# Patient Record
Sex: Female | Born: 1960 | Race: White | Hispanic: No | Marital: Single | Smoking: Former smoker
Health system: Southern US, Community
[De-identification: ages and names within clinical notes are randomized; demographics above are authoritative.]

## PROBLEM LIST (undated history)

## (undated) DIAGNOSIS — I10 Essential (primary) hypertension: Secondary | ICD-10-CM

## (undated) DIAGNOSIS — G43909 Migraine, unspecified, not intractable, without status migrainosus: Secondary | ICD-10-CM

## (undated) DIAGNOSIS — E119 Type 2 diabetes mellitus without complications: Secondary | ICD-10-CM

## (undated) DIAGNOSIS — F419 Anxiety disorder, unspecified: Secondary | ICD-10-CM

## (undated) DIAGNOSIS — Z923 Personal history of irradiation: Secondary | ICD-10-CM

## (undated) DIAGNOSIS — C50919 Malignant neoplasm of unspecified site of unspecified female breast: Secondary | ICD-10-CM

## (undated) HISTORY — DX: Malignant neoplasm of unspecified site of unspecified female breast: C50.919

## (undated) HISTORY — DX: Type 2 diabetes mellitus without complications: E11.9

## (undated) HISTORY — PX: ABDOMINAL HYSTERECTOMY: SHX81

## (undated) HISTORY — DX: Essential (primary) hypertension: I10

## (undated) HISTORY — DX: Anxiety disorder, unspecified: F41.9

---

## 2006-07-25 HISTORY — PX: JOINT REPLACEMENT: SHX530

## 2006-11-23 ENCOUNTER — Other Ambulatory Visit: Payer: Self-pay

## 2006-11-23 ENCOUNTER — Ambulatory Visit: Payer: Self-pay | Admitting: Specialist

## 2006-11-27 ENCOUNTER — Inpatient Hospital Stay: Payer: Self-pay | Admitting: Specialist

## 2008-07-25 DIAGNOSIS — C50919 Malignant neoplasm of unspecified site of unspecified female breast: Secondary | ICD-10-CM

## 2008-07-25 HISTORY — DX: Malignant neoplasm of unspecified site of unspecified female breast: C50.919

## 2008-07-25 HISTORY — PX: LYMPH NODE DISSECTION: SHX5087

## 2008-07-25 HISTORY — PX: BILATERAL TOTAL MASTECTOMY WITH AXILLARY LYMPH NODE DISSECTION: SHX6364

## 2008-07-25 HISTORY — PX: PORTACATH PLACEMENT: SHX2246

## 2009-04-02 ENCOUNTER — Emergency Department: Payer: Self-pay | Admitting: Emergency Medicine

## 2009-09-22 ENCOUNTER — Ambulatory Visit: Payer: Self-pay | Admitting: Oncology

## 2010-02-22 ENCOUNTER — Ambulatory Visit: Payer: Self-pay | Admitting: Radiation Oncology

## 2010-03-15 ENCOUNTER — Ambulatory Visit: Payer: Self-pay | Admitting: Radiation Oncology

## 2010-03-25 ENCOUNTER — Ambulatory Visit: Payer: Self-pay | Admitting: Radiation Oncology

## 2010-04-24 ENCOUNTER — Ambulatory Visit: Payer: Self-pay | Admitting: Radiation Oncology

## 2010-05-25 ENCOUNTER — Ambulatory Visit: Payer: Self-pay | Admitting: Radiation Oncology

## 2010-06-24 ENCOUNTER — Ambulatory Visit: Payer: Self-pay | Admitting: Radiation Oncology

## 2010-07-25 HISTORY — PX: PORT-A-CATH REMOVAL: SHX5289

## 2010-08-10 ENCOUNTER — Ambulatory Visit: Payer: Self-pay | Admitting: Radiation Oncology

## 2010-08-11 LAB — CANCER ANTIGEN 27.29: CA 27.29: 7.7 U/mL (ref 0.0–38.6)

## 2010-08-25 ENCOUNTER — Ambulatory Visit: Payer: Self-pay | Admitting: Radiation Oncology

## 2011-01-14 ENCOUNTER — Ambulatory Visit: Payer: Self-pay | Admitting: Internal Medicine

## 2011-01-23 ENCOUNTER — Ambulatory Visit: Payer: Self-pay | Admitting: Internal Medicine

## 2011-02-07 ENCOUNTER — Ambulatory Visit: Payer: Self-pay | Admitting: Gastroenterology

## 2011-02-08 LAB — PATHOLOGY REPORT

## 2011-05-13 ENCOUNTER — Ambulatory Visit: Payer: Self-pay | Admitting: Internal Medicine

## 2011-05-26 ENCOUNTER — Ambulatory Visit: Payer: Self-pay | Admitting: Internal Medicine

## 2011-06-25 ENCOUNTER — Ambulatory Visit: Payer: Self-pay | Admitting: Internal Medicine

## 2011-07-01 ENCOUNTER — Emergency Department: Payer: Self-pay | Admitting: Emergency Medicine

## 2011-07-26 ENCOUNTER — Ambulatory Visit: Payer: Self-pay | Admitting: Internal Medicine

## 2011-08-26 ENCOUNTER — Ambulatory Visit: Payer: Self-pay | Admitting: Internal Medicine

## 2011-09-27 ENCOUNTER — Ambulatory Visit: Payer: Self-pay | Admitting: Cardiothoracic Surgery

## 2011-10-06 ENCOUNTER — Ambulatory Visit: Payer: Self-pay | Admitting: Radiation Oncology

## 2011-10-24 ENCOUNTER — Ambulatory Visit: Payer: Self-pay | Admitting: Internal Medicine

## 2011-11-15 ENCOUNTER — Ambulatory Visit: Payer: Self-pay | Admitting: Radiation Oncology

## 2011-11-15 ENCOUNTER — Encounter: Payer: Self-pay | Admitting: Physical Medicine & Rehabilitation

## 2011-11-15 LAB — CBC CANCER CENTER
Eosinophil #: 0.2 x10 3/mm (ref 0.0–0.7)
Lymphocyte #: 2.2 x10 3/mm (ref 1.0–3.6)
Monocyte #: 0.6 x10 3/mm (ref 0.2–0.9)
Monocyte %: 5.6 %
Neutrophil #: 6.9 x10 3/mm — ABNORMAL HIGH (ref 1.4–6.5)
Neutrophil %: 69.6 %
RBC: 4.48 10*6/uL (ref 3.80–5.20)
RDW: 14 % (ref 11.5–14.5)

## 2011-11-15 LAB — HEPATIC FUNCTION PANEL A (ARMC)
Albumin: 3.8 g/dL (ref 3.4–5.0)
Alkaline Phosphatase: 101 U/L (ref 50–136)
Bilirubin,Total: 0.4 mg/dL (ref 0.2–1.0)
SGPT (ALT): 55 U/L
Total Protein: 7.2 g/dL (ref 6.4–8.2)

## 2011-11-15 LAB — CREATININE, SERUM
Creatinine: 0.95 mg/dL (ref 0.60–1.30)
EGFR (African American): >60
EGFR (Non-African Amer.): >60

## 2011-11-23 ENCOUNTER — Encounter: Payer: Self-pay | Admitting: Physical Medicine & Rehabilitation

## 2011-11-23 ENCOUNTER — Ambulatory Visit: Payer: Self-pay | Admitting: Radiation Oncology

## 2011-11-23 ENCOUNTER — Ambulatory Visit: Payer: Self-pay | Admitting: Internal Medicine

## 2011-12-24 ENCOUNTER — Encounter: Payer: Self-pay | Admitting: Physical Medicine & Rehabilitation

## 2012-01-23 ENCOUNTER — Encounter: Payer: Self-pay | Admitting: Physical Medicine & Rehabilitation

## 2012-02-23 ENCOUNTER — Encounter: Payer: Self-pay | Admitting: Physical Medicine & Rehabilitation

## 2012-03-20 ENCOUNTER — Ambulatory Visit: Payer: Self-pay | Admitting: General Surgery

## 2012-03-27 ENCOUNTER — Ambulatory Visit: Payer: Self-pay | Admitting: Anesthesiology

## 2012-03-27 LAB — BASIC METABOLIC PANEL
Anion Gap: 5 — ABNORMAL LOW (ref 7–16)
BUN: 12 mg/dL (ref 7–18)
Calcium, Total: 9.1 mg/dL (ref 8.5–10.1)
Chloride: 101 mmol/L (ref 98–107)
Co2: 31 mmol/L (ref 21–32)
Creatinine: 0.66 mg/dL (ref 0.60–1.30)
Glucose: 122 mg/dL — ABNORMAL HIGH (ref 65–99)
Osmolality: 275 (ref 275–301)

## 2012-03-27 LAB — CBC WITH DIFFERENTIAL/PLATELET
Eosinophil #: 0.1 10*3/uL (ref 0.0–0.7)
HCT: 40.5 % (ref 35.0–47.0)
Lymphocyte %: 19 %
MCV: 89 fL (ref 80–100)
Monocyte %: 5.1 %
Neutrophil #: 7.2 10*3/uL — ABNORMAL HIGH (ref 1.4–6.5)
Neutrophil %: 73.9 %
RBC: 4.58 10*6/uL (ref 3.80–5.20)
RDW: 14.1 % (ref 11.5–14.5)
WBC: 9.8 10*3/uL (ref 3.6–11.0)

## 2012-03-29 ENCOUNTER — Ambulatory Visit: Payer: Self-pay | Admitting: General Surgery

## 2012-03-29 HISTORY — PX: HERNIA REPAIR: SHX51

## 2012-03-30 ENCOUNTER — Encounter: Payer: Self-pay | Admitting: Physical Medicine & Rehabilitation

## 2012-05-16 ENCOUNTER — Ambulatory Visit: Payer: Self-pay | Admitting: Internal Medicine

## 2012-05-16 LAB — CBC CANCER CENTER
Lymphocyte %: 21.8 %
MCHC: 32.2 g/dL (ref 32.0–36.0)
MCV: 89 fL (ref 80–100)
Monocyte #: 0.6 x10 3/mm (ref 0.2–0.9)
Monocyte %: 6.1 %
Neutrophil #: 6.7 x10 3/mm — ABNORMAL HIGH (ref 1.4–6.5)
Platelet: 258 x10 3/mm (ref 150–440)
RDW: 13.5 % (ref 11.5–14.5)
WBC: 9.7 x10 3/mm (ref 3.6–11.0)

## 2012-05-16 LAB — HEPATIC FUNCTION PANEL A (ARMC)
Albumin: 3.7 g/dL (ref 3.4–5.0)
Bilirubin, Direct: 0.1 mg/dL (ref 0.00–0.20)
Bilirubin,Total: 0.3 mg/dL (ref 0.2–1.0)
SGOT(AST): 37 U/L (ref 15–37)
SGPT (ALT): 64 U/L (ref 12–78)
Total Protein: 7.5 g/dL (ref 6.4–8.2)

## 2012-05-16 LAB — CREATININE, SERUM
Creatinine: 0.85 mg/dL (ref 0.60–1.30)
EGFR (African American): >60
EGFR (Non-African Amer.): >60

## 2012-05-17 LAB — CANCER ANTIGEN 27.29: CA 27.29: 7.6 U/mL (ref 0.0–38.6)

## 2012-05-25 ENCOUNTER — Ambulatory Visit: Payer: Self-pay | Admitting: Internal Medicine

## 2012-06-24 ENCOUNTER — Ambulatory Visit: Payer: Self-pay | Admitting: Internal Medicine

## 2012-10-23 ENCOUNTER — Ambulatory Visit: Payer: Self-pay | Admitting: Internal Medicine

## 2012-11-14 LAB — URINALYSIS, COMPLETE
Bacteria: NONE SEEN
Bilirubin,UR: NEGATIVE
Blood: NEGATIVE
Glucose,UR: NEGATIVE mg/dL (ref 0–75)
Leukocyte Esterase: NEGATIVE
Protein: NEGATIVE
RBC,UR: 1 /HPF (ref 0–5)
Specific Gravity: 1.019 (ref 1.003–1.030)
Squamous Epithelial: <1

## 2012-11-14 LAB — HEPATIC FUNCTION PANEL A (ARMC)
Albumin: 3.6 g/dL (ref 3.4–5.0)
Bilirubin,Total: 0.4 mg/dL (ref 0.2–1.0)
SGPT (ALT): 58 U/L (ref 12–78)
Total Protein: 7.2 g/dL (ref 6.4–8.2)

## 2012-11-14 LAB — CBC CANCER CENTER
Basophil #: 0.1 x10 3/mm (ref 0.0–0.1)
Basophil %: 0.6 %
HCT: 41.7 % (ref 35.0–47.0)
Lymphocyte %: 20.2 %
MCH: 29.3 pg (ref 26.0–34.0)
MCHC: 33.1 g/dL (ref 32.0–36.0)
MCV: 89 fL (ref 80–100)
Monocyte #: 0.5 x10 3/mm (ref 0.2–0.9)
Monocyte %: 5 %
Neutrophil #: 7.7 x10 3/mm — ABNORMAL HIGH (ref 1.4–6.5)
Neutrophil %: 72.7 %
RBC: 4.71 10*6/uL (ref 3.80–5.20)
RDW: 13.2 % (ref 11.5–14.5)

## 2012-11-14 LAB — CREATININE, SERUM
Creatinine: 0.92 mg/dL (ref 0.60–1.30)
EGFR (African American): >60

## 2012-11-15 LAB — CANCER ANTIGEN 27.29: CA 27.29: 8.9 U/mL (ref 0.0–38.6)

## 2012-11-16 LAB — URINE CULTURE

## 2012-11-22 ENCOUNTER — Ambulatory Visit: Payer: Self-pay | Admitting: Internal Medicine

## 2012-12-05 ENCOUNTER — Ambulatory Visit: Payer: Self-pay | Admitting: Orthopaedic Surgery

## 2013-05-15 ENCOUNTER — Ambulatory Visit: Payer: Self-pay | Admitting: Internal Medicine

## 2013-05-15 LAB — CBC CANCER CENTER
Basophil #: 0.1 x10 3/mm (ref 0.0–0.1)
Basophil %: 0.9 %
Eosinophil #: 0.2 x10 3/mm (ref 0.0–0.7)
Eosinophil %: 2 %
HCT: 44 % (ref 35.0–47.0)
Lymphocyte #: 2.3 x10 3/mm (ref 1.0–3.6)
MCV: 87 fL (ref 80–100)
Monocyte #: 0.6 x10 3/mm (ref 0.2–0.9)
Monocyte %: 6.5 %
Platelet: 281 x10 3/mm (ref 150–440)

## 2013-05-15 LAB — URINALYSIS, COMPLETE
Bilirubin,UR: NEGATIVE
Blood: NEGATIVE
Leukocyte Esterase: NEGATIVE
Nitrite: NEGATIVE
Ph: 5 (ref 4.5–8.0)
Specific Gravity: 1.015 (ref 1.003–1.030)
Squamous Epithelial: <1
WBC UR: 1 /HPF (ref 0–5)

## 2013-05-15 LAB — CREATININE, SERUM
Creatinine: 0.76 mg/dL (ref 0.60–1.30)
EGFR (African American): >60
EGFR (Non-African Amer.): >60

## 2013-05-15 LAB — HEPATIC FUNCTION PANEL A (ARMC)
Albumin: 3.8 g/dL (ref 3.4–5.0)
Alkaline Phosphatase: 103 U/L (ref 50–136)
Bilirubin,Total: 0.4 mg/dL (ref 0.2–1.0)
SGOT(AST): 58 U/L — ABNORMAL HIGH (ref 15–37)

## 2013-05-25 ENCOUNTER — Ambulatory Visit: Payer: Self-pay | Admitting: Internal Medicine

## 2013-05-27 ENCOUNTER — Ambulatory Visit: Payer: Self-pay | Admitting: Internal Medicine

## 2013-05-28 ENCOUNTER — Ambulatory Visit: Payer: Self-pay | Admitting: Internal Medicine

## 2013-07-28 ENCOUNTER — Emergency Department: Payer: Self-pay | Admitting: Internal Medicine

## 2013-07-31 LAB — BETA STREP CULTURE(ARMC)

## 2013-11-12 ENCOUNTER — Ambulatory Visit: Payer: Self-pay | Admitting: Internal Medicine

## 2013-12-11 ENCOUNTER — Ambulatory Visit: Payer: Self-pay | Admitting: Internal Medicine

## 2013-12-11 ENCOUNTER — Emergency Department: Payer: Self-pay | Admitting: Emergency Medicine

## 2013-12-11 LAB — PROTIME-INR
INR: 0.9
Prothrombin Time: 11.6 secs (ref 11.5–14.7)

## 2013-12-11 LAB — COMPREHENSIVE METABOLIC PANEL
ALK PHOS: 153 U/L — AB
ALT: 104 U/L — AB (ref 12–78)
Albumin: 3.3 g/dL — ABNORMAL LOW (ref 3.4–5.0)
Anion Gap: 6 — ABNORMAL LOW (ref 7–16)
BUN: 12 mg/dL (ref 7–18)
Bilirubin,Total: 0.5 mg/dL (ref 0.2–1.0)
CO2: 29 mmol/L (ref 21–32)
Calcium, Total: 8.8 mg/dL (ref 8.5–10.1)
Chloride: 101 mmol/L (ref 98–107)
Creatinine: 0.82 mg/dL (ref 0.60–1.30)
EGFR (Non-African Amer.): >60
GLUCOSE: 118 mg/dL — AB (ref 65–99)
Osmolality: 273 (ref 275–301)
Potassium: 3.6 mmol/L (ref 3.5–5.1)
SGOT(AST): 147 U/L — ABNORMAL HIGH (ref 15–37)
Sodium: 136 mmol/L (ref 136–145)
Total Protein: 7.3 g/dL (ref 6.4–8.2)

## 2013-12-11 LAB — CREATININE, SERUM: CREATININE: 0.76 mg/dL (ref 0.60–1.30)

## 2013-12-11 LAB — CBC
HCT: 39.9 % (ref 35.0–47.0)
HGB: 13.2 g/dL (ref 12.0–16.0)
MCH: 28.7 pg (ref 26.0–34.0)
MCHC: 33.2 g/dL (ref 32.0–36.0)
MCV: 87 fL (ref 80–100)
PLATELETS: 211 10*3/uL (ref 150–440)
RBC: 4.61 10*6/uL (ref 3.80–5.20)
RDW: 14.3 % (ref 11.5–14.5)
WBC: 6 10*3/uL (ref 3.6–11.0)

## 2013-12-11 LAB — CBC CANCER CENTER
BASOS ABS: 0 x10 3/mm (ref 0.0–0.1)
Basophil %: 0.4 %
EOS ABS: 0.1 x10 3/mm (ref 0.0–0.7)
EOS PCT: 1.3 %
HCT: 41.1 % (ref 35.0–47.0)
HGB: 13.7 g/dL (ref 12.0–16.0)
LYMPHS PCT: 29.5 %
Lymphocyte #: 1.8 x10 3/mm (ref 1.0–3.6)
MCH: 28.5 pg (ref 26.0–34.0)
MCHC: 33.4 g/dL (ref 32.0–36.0)
MCV: 85 fL (ref 80–100)
Monocyte #: 0.5 x10 3/mm (ref 0.2–0.9)
Monocyte %: 7.3 %
NEUTROS ABS: 3.8 x10 3/mm (ref 1.4–6.5)
NEUTROS PCT: 61.5 %
PLATELETS: 219 x10 3/mm (ref 150–440)
RBC: 4.82 10*6/uL (ref 3.80–5.20)
RDW: 14.1 % (ref 11.5–14.5)
WBC: 6.2 x10 3/mm (ref 3.6–11.0)

## 2013-12-11 LAB — URINALYSIS, COMPLETE
BILIRUBIN, UR: NEGATIVE
BLOOD: NEGATIVE
Bacteria: NONE SEEN
Glucose,UR: 50 mg/dL (ref 0–75)
Ketone: NEGATIVE
Nitrite: NEGATIVE
Ph: 6 (ref 4.5–8.0)
Protein: NEGATIVE
RBC,UR: 5 /HPF (ref 0–5)
SPECIFIC GRAVITY: 1.015 (ref 1.003–1.030)
Squamous Epithelial: NONE SEEN
Transitional Epi: <1

## 2013-12-11 LAB — HEPATIC FUNCTION PANEL A (ARMC)
ALBUMIN: 3.4 g/dL (ref 3.4–5.0)
ALK PHOS: 170 U/L — AB
ALT: 113 U/L — AB (ref 12–78)
BILIRUBIN TOTAL: 0.5 mg/dL (ref 0.2–1.0)
Bilirubin, Direct: 0.2 mg/dL (ref 0.00–0.20)
SGOT(AST): 135 U/L — ABNORMAL HIGH (ref 15–37)
TOTAL PROTEIN: 7.6 g/dL (ref 6.4–8.2)

## 2013-12-11 LAB — TROPONIN I: Troponin-I: <0.02 ng/mL

## 2013-12-11 LAB — LIPASE, BLOOD: LIPASE: 178 U/L (ref 73–393)

## 2013-12-12 LAB — CANCER ANTIGEN 27.29: CA 27.29: 927.6 U/mL — ABNORMAL HIGH (ref 0.0–38.6)

## 2013-12-18 ENCOUNTER — Ambulatory Visit: Payer: Self-pay | Admitting: Internal Medicine

## 2013-12-23 ENCOUNTER — Ambulatory Visit: Payer: Self-pay | Admitting: Internal Medicine

## 2014-01-02 ENCOUNTER — Ambulatory Visit: Payer: Self-pay | Admitting: Gastroenterology

## 2014-01-07 LAB — PATHOLOGY REPORT

## 2014-01-15 LAB — CALCIUM: Calcium, Total: 9.1 mg/dL (ref 8.5–10.1)

## 2014-01-15 LAB — CREATININE, SERUM
CREATININE: 0.81 mg/dL (ref 0.60–1.30)
EGFR (African American): >60

## 2014-01-22 ENCOUNTER — Ambulatory Visit: Payer: Self-pay | Admitting: Internal Medicine

## 2014-02-05 ENCOUNTER — Ambulatory Visit: Payer: Self-pay | Admitting: Gastroenterology

## 2014-02-07 ENCOUNTER — Ambulatory Visit: Payer: Self-pay | Admitting: Gastroenterology

## 2014-02-07 LAB — CBC WITH DIFFERENTIAL/PLATELET
BASOS ABS: 0 10*3/uL (ref 0.0–0.1)
Basophil %: 0.4 %
EOS PCT: 1.1 %
Eosinophil #: 0.1 10*3/uL (ref 0.0–0.7)
HCT: 47.8 % — ABNORMAL HIGH (ref 35.0–47.0)
HGB: 15.3 g/dL (ref 12.0–16.0)
LYMPHS ABS: 1.3 10*3/uL (ref 1.0–3.6)
Lymphocyte %: 18.6 %
MCH: 28 pg (ref 26.0–34.0)
MCHC: 31.9 g/dL — ABNORMAL LOW (ref 32.0–36.0)
MCV: 88 fL (ref 80–100)
MONOS PCT: 7.2 %
Monocyte #: 0.5 x10 3/mm (ref 0.2–0.9)
NEUTROS PCT: 72.7 %
Neutrophil #: 5.2 10*3/uL (ref 1.4–6.5)
Platelet: 286 10*3/uL (ref 150–440)
RBC: 5.45 10*6/uL — ABNORMAL HIGH (ref 3.80–5.20)
RDW: 14.3 % (ref 11.5–14.5)
WBC: 7.1 10*3/uL (ref 3.6–11.0)

## 2014-02-07 LAB — PATHOLOGY REPORT

## 2014-02-07 LAB — PROTIME-INR
INR: 0.9
PROTHROMBIN TIME: 12.3 s (ref 11.5–14.7)

## 2014-02-13 LAB — PATHOLOGY REPORT

## 2014-02-17 ENCOUNTER — Emergency Department: Payer: Self-pay | Admitting: Emergency Medicine

## 2014-02-17 LAB — COMPREHENSIVE METABOLIC PANEL
ALK PHOS: 387 U/L — AB
ALT: 78 U/L — AB
Albumin: 3.2 g/dL — ABNORMAL LOW (ref 3.4–5.0)
Anion Gap: 9 (ref 7–16)
BILIRUBIN TOTAL: 1.6 mg/dL — AB (ref 0.2–1.0)
BUN: 20 mg/dL — ABNORMAL HIGH (ref 7–18)
CO2: 29 mmol/L (ref 21–32)
CREATININE: 1.63 mg/dL — AB (ref 0.60–1.30)
Calcium, Total: 9 mg/dL (ref 8.5–10.1)
Chloride: 97 mmol/L — ABNORMAL LOW (ref 98–107)
GFR CALC AF AMER: 41 — AB
GFR CALC NON AF AMER: 35 — AB
GLUCOSE: 146 mg/dL — AB (ref 65–99)
OSMOLALITY: 275 (ref 275–301)
Potassium: 3.4 mmol/L — ABNORMAL LOW (ref 3.5–5.1)
SGOT(AST): 266 U/L — ABNORMAL HIGH (ref 15–37)
SODIUM: 135 mmol/L — AB (ref 136–145)
Total Protein: 8.5 g/dL — ABNORMAL HIGH (ref 6.4–8.2)

## 2014-02-17 LAB — URINALYSIS, COMPLETE
BILIRUBIN, UR: NEGATIVE
BLOOD: NEGATIVE
Bacteria: NONE SEEN
Glucose,UR: NEGATIVE mg/dL (ref 0–75)
KETONE: NEGATIVE
Leukocyte Esterase: NEGATIVE
Nitrite: NEGATIVE
Ph: 5 (ref 4.5–8.0)
Protein: 100
Specific Gravity: 1.026 (ref 1.003–1.030)
Squamous Epithelial: NONE SEEN
WBC UR: 15 /HPF (ref 0–5)

## 2014-02-17 LAB — CBC
HCT: 51.2 % — ABNORMAL HIGH (ref 35.0–47.0)
HGB: 16.3 g/dL — ABNORMAL HIGH (ref 12.0–16.0)
MCH: 27.5 pg (ref 26.0–34.0)
MCHC: 31.7 g/dL — ABNORMAL LOW (ref 32.0–36.0)
MCV: 87 fL (ref 80–100)
Platelet: 342 10*3/uL (ref 150–440)
RBC: 5.92 10*6/uL — ABNORMAL HIGH (ref 3.80–5.20)
RDW: 14.7 % — ABNORMAL HIGH (ref 11.5–14.5)
WBC: 12.2 10*3/uL — AB (ref 3.6–11.0)

## 2014-02-22 ENCOUNTER — Ambulatory Visit: Payer: Self-pay | Admitting: Internal Medicine

## 2014-02-28 LAB — CBC CANCER CENTER
Basophil #: 0 x10 3/mm (ref 0.0–0.1)
Basophil %: 0.8 %
Eosinophil #: 0.1 x10 3/mm (ref 0.0–0.7)
Eosinophil %: 1.4 %
HCT: 39.9 % (ref 35.0–47.0)
HGB: 12.8 g/dL (ref 12.0–16.0)
LYMPHS ABS: 1.4 x10 3/mm (ref 1.0–3.6)
Lymphocyte %: 24.5 %
MCH: 27.7 pg (ref 26.0–34.0)
MCHC: 31.9 g/dL — ABNORMAL LOW (ref 32.0–36.0)
MCV: 87 fL (ref 80–100)
MONOS PCT: 11 %
Monocyte #: 0.6 x10 3/mm (ref 0.2–0.9)
NEUTROS PCT: 62.3 %
Neutrophil #: 3.6 x10 3/mm (ref 1.4–6.5)
PLATELETS: 241 x10 3/mm (ref 150–440)
RBC: 4.6 10*6/uL (ref 3.80–5.20)
RDW: 14.4 % (ref 11.5–14.5)
WBC: 5.8 x10 3/mm (ref 3.6–11.0)

## 2014-02-28 LAB — CREATININE, SERUM
Creatinine: 0.91 mg/dL (ref 0.60–1.30)
EGFR (Non-African Amer.): >60

## 2014-02-28 LAB — CALCIUM: CALCIUM: 8.3 mg/dL — AB (ref 8.5–10.1)

## 2014-03-25 ENCOUNTER — Ambulatory Visit: Payer: Self-pay | Admitting: Internal Medicine

## 2014-03-27 ENCOUNTER — Inpatient Hospital Stay: Payer: Self-pay | Admitting: Specialist

## 2014-03-27 LAB — LIPASE, BLOOD: Lipase: 104 U/L (ref 73–393)

## 2014-03-27 LAB — COMPREHENSIVE METABOLIC PANEL
ALBUMIN: 2.5 g/dL — AB (ref 3.4–5.0)
AST: 396 U/L — AB (ref 15–37)
Alkaline Phosphatase: 529 U/L — ABNORMAL HIGH
Anion Gap: 8 (ref 7–16)
BUN: 9 mg/dL (ref 7–18)
Bilirubin,Total: 6.6 mg/dL — ABNORMAL HIGH (ref 0.2–1.0)
CALCIUM: 8.1 mg/dL — AB (ref 8.5–10.1)
CREATININE: 0.9 mg/dL (ref 0.60–1.30)
Chloride: 106 mmol/L (ref 98–107)
Co2: 23 mmol/L (ref 21–32)
Glucose: 103 mg/dL — ABNORMAL HIGH (ref 65–99)
Osmolality: 273 (ref 275–301)
POTASSIUM: 3.5 mmol/L (ref 3.5–5.1)
SGPT (ALT): 58 U/L
SODIUM: 137 mmol/L (ref 136–145)
Total Protein: 6.9 g/dL (ref 6.4–8.2)

## 2014-03-27 LAB — CBC
HCT: 44.5 % (ref 35.0–47.0)
HGB: 14.6 g/dL (ref 12.0–16.0)
MCH: 27.8 pg (ref 26.0–34.0)
MCHC: 32.9 g/dL (ref 32.0–36.0)
MCV: 85 fL (ref 80–100)
Platelet: 200 10*3/uL (ref 150–440)
RBC: 5.25 10*6/uL — AB (ref 3.80–5.20)
RDW: 15.8 % — ABNORMAL HIGH (ref 11.5–14.5)
WBC: 10.5 10*3/uL (ref 3.6–11.0)

## 2014-03-27 LAB — TROPONIN I: Troponin-I: <0.02 ng/mL

## 2014-03-28 LAB — CBC WITH DIFFERENTIAL/PLATELET
BASOS ABS: 0 10*3/uL (ref 0.0–0.1)
Basophil %: 0.4 %
EOS PCT: 0.4 %
Eosinophil #: 0 10*3/uL (ref 0.0–0.7)
HCT: 40.6 % (ref 35.0–47.0)
HGB: 13 g/dL (ref 12.0–16.0)
Lymphocyte #: 1.5 10*3/uL (ref 1.0–3.6)
Lymphocyte %: 16.6 %
MCH: 27.6 pg (ref 26.0–34.0)
MCHC: 32 g/dL (ref 32.0–36.0)
MCV: 86 fL (ref 80–100)
MONO ABS: 0.8 x10 3/mm (ref 0.2–0.9)
MONOS PCT: 9.6 %
Neutrophil #: 6.4 10*3/uL (ref 1.4–6.5)
Neutrophil %: 73 %
Platelet: 166 10*3/uL (ref 150–440)
RBC: 4.7 10*6/uL (ref 3.80–5.20)
RDW: 16.2 % — ABNORMAL HIGH (ref 11.5–14.5)
WBC: 8.8 10*3/uL (ref 3.6–11.0)

## 2014-03-28 LAB — URINALYSIS, COMPLETE
Blood: NEGATIVE
Glucose,UR: NEGATIVE mg/dL (ref 0–75)
KETONE: NEGATIVE
Leukocyte Esterase: NEGATIVE
Nitrite: NEGATIVE
Ph: 5 (ref 4.5–8.0)
Protein: NEGATIVE
Specific Gravity: 1.057 (ref 1.003–1.030)
Squamous Epithelial: <1

## 2014-03-28 LAB — COMPREHENSIVE METABOLIC PANEL
ALK PHOS: 467 U/L — AB
Albumin: 2.3 g/dL — ABNORMAL LOW (ref 3.4–5.0)
Anion Gap: 11 (ref 7–16)
BILIRUBIN TOTAL: 6.6 mg/dL — AB (ref 0.2–1.0)
BUN: 9 mg/dL (ref 7–18)
CREATININE: 0.81 mg/dL (ref 0.60–1.30)
Calcium, Total: 7.6 mg/dL — ABNORMAL LOW (ref 8.5–10.1)
Chloride: 104 mmol/L (ref 98–107)
Co2: 23 mmol/L (ref 21–32)
EGFR (African American): >60
EGFR (Non-African Amer.): >60
Glucose: 99 mg/dL (ref 65–99)
Osmolality: 274 (ref 275–301)
POTASSIUM: 3.3 mmol/L — AB (ref 3.5–5.1)
SGOT(AST): 359 U/L — ABNORMAL HIGH (ref 15–37)
SGPT (ALT): 53 U/L
Sodium: 138 mmol/L (ref 136–145)
Total Protein: 6.2 g/dL — ABNORMAL LOW (ref 6.4–8.2)

## 2014-03-29 LAB — COMPREHENSIVE METABOLIC PANEL
ALK PHOS: 429 U/L — AB
ALT: 53 U/L
Albumin: 2.1 g/dL — ABNORMAL LOW (ref 3.4–5.0)
Anion Gap: 9 (ref 7–16)
BILIRUBIN TOTAL: 6 mg/dL — AB (ref 0.2–1.0)
BUN: 8 mg/dL (ref 7–18)
Calcium, Total: 7.3 mg/dL — ABNORMAL LOW (ref 8.5–10.1)
Chloride: 107 mmol/L (ref 98–107)
Co2: 22 mmol/L (ref 21–32)
Creatinine: 0.93 mg/dL (ref 0.60–1.30)
EGFR (Non-African Amer.): >60
GLUCOSE: 115 mg/dL — AB (ref 65–99)
Osmolality: 275 (ref 275–301)
Potassium: 3.7 mmol/L (ref 3.5–5.1)
SGOT(AST): 346 U/L — ABNORMAL HIGH (ref 15–37)
SODIUM: 138 mmol/L (ref 136–145)
Total Protein: 6 g/dL — ABNORMAL LOW (ref 6.4–8.2)

## 2014-03-29 LAB — AMMONIA: AMMONIA, PLASMA: 57 umol/L — AB (ref 11–32)

## 2014-04-01 ENCOUNTER — Ambulatory Visit: Payer: Self-pay | Admitting: Internal Medicine

## 2014-04-01 LAB — CULTURE, BLOOD (SINGLE)

## 2014-04-03 ENCOUNTER — Ambulatory Visit (INDEPENDENT_AMBULATORY_CARE_PROVIDER_SITE_OTHER): Payer: Medicare Other | Admitting: General Surgery

## 2014-04-03 ENCOUNTER — Encounter: Payer: Self-pay | Admitting: General Surgery

## 2014-04-03 VITALS — BP 118/80 | HR 76 | Resp 12 | Ht 67.0 in | Wt 277.0 lb

## 2014-04-03 DIAGNOSIS — C50919 Malignant neoplasm of unspecified site of unspecified female breast: Secondary | ICD-10-CM

## 2014-04-03 LAB — CBC CANCER CENTER
BASOS PCT: 0.1 %
Basophil #: 0 x10 3/mm (ref 0.0–0.1)
EOS PCT: 0.1 %
Eosinophil #: 0 x10 3/mm (ref 0.0–0.7)
HCT: 40.2 % (ref 35.0–47.0)
HGB: 13 g/dL (ref 12.0–16.0)
LYMPHS PCT: 13.8 %
Lymphocyte #: 1.3 x10 3/mm (ref 1.0–3.6)
MCH: 27.3 pg (ref 26.0–34.0)
MCHC: 32.2 g/dL (ref 32.0–36.0)
MCV: 85 fL (ref 80–100)
Monocyte #: 0.8 x10 3/mm (ref 0.2–0.9)
Monocyte %: 8.6 %
NEUTROS PCT: 77.4 %
Neutrophil #: 7.5 x10 3/mm — ABNORMAL HIGH (ref 1.4–6.5)
Platelet: 169 x10 3/mm (ref 150–440)
RBC: 4.75 10*6/uL (ref 3.80–5.20)
RDW: 16.2 % — AB (ref 11.5–14.5)
WBC: 9.7 x10 3/mm (ref 3.6–11.0)

## 2014-04-03 NOTE — Patient Instructions (Signed)
Implanted Port Insertion  An implanted port is a central line that has a round shape and is placed under the skin. It is used as a long-term IV access for:    Medicines, such as chemotherapy.    Fluids.    Liquid nutrition, such as total parenteral nutrition (TPN).    Blood samples.   LET YOUR HEALTH CARE PROVIDER KNOW ABOUT:   Allergies to food or medicine.    Medicines taken, including vitamins, herbs, eye drops, creams, and over-the-counter medicines.    Any allergies to heparin.   Use of steroids (by mouth or creams).    Previous problems with anesthetics or numbing medicines.    History of bleeding problems or blood clots.    Previous surgery.    Other health problems, including diabetes and kidney problems.    Possibility of pregnancy, if this applies.  RISKS AND COMPLICATIONS  Generally, this is a safe procedure. However, as with any procedure, problems can occur. Possible problems include:   Damage to the blood vessel, bruising, or bleeding at the puncture site.    Infection.   Blood clot in the vessel that the port is in.   Breakdown of the skin over your port.   Very rarely a person may develop a condition called a pneumothorax, a collection of air in the chest that may cause one of the lungs to collapse. The placement of these catheters with the appropriate imaging guidance significantly decreases the risk of a pneumothorax.   BEFORE THE PROCEDURE    Your health care provider may want you to have blood tests. These tests can help tell how well your kidneys and liver are working. They can also show how well your blood clots.    If you take blood thinners (anticoagulant medicines), ask your health care provider when you should stop taking them.    Make arrangements for someone to drive you home. This is necessary if you have been sedated for your procedure.   PROCEDURE   Port insertion usually takes about 30-45 minutes.    An IV needle will be inserted in your arm.  Medicine for pain and medicine to help relax you (sedative) will flow directly into your body through this needle.    You will lie on an exam table, and you will be connected to monitors to keep track of your heart rate, blood pressure, and breathing throughout the procedure.   An oxygen monitoring device may be attached to your finger. Oxygen will be given.    Everything will be kept as germ free (sterile) as possible during the procedure. The skin near the point of the incision will be cleansed with antiseptic, and the area will be draped with sterile towels. The skin and deeper tissues over the port area will be made numb with a local anesthetic.   Two small cuts (incisions) will be made in the skin to insert the port. One will be made in the neck to obtain access to the vein where the catheter will lie.    Because the port reservoir will be placed under the skin, a small skin incision will be made in the upper chest, and a small pocket for the port will be made under the skin. The catheter that will be connected to the port tunnels to a large central vein in the chest. A small, raised area will remain on your body at the site of the reservoir when the procedure is complete.   The port placement   will be done under imaging guidance to ensure the proper placement.   The reservoir has a silicone covering that can be punctured with a special needle.    The port will be flushed with normal saline, and blood will be drawn to make sure it is working properly.   There will be nothing remaining outside the skin when the procedure is finished.    Incisions will be held together by stitches, surgical glue, or a special tape.  AFTER THE PROCEDURE   You will stay in a recovery area until the anesthesia has worn off. Your blood pressure and pulse will be checked.   A final chest X-ray will be taken to check the placement of the port and to ensure that there is no injury to your lung.  Document Released:  05/01/2013 Document Revised: 11/25/2013 Document Reviewed: 05/01/2013  Exit[employer] Patient Information 2015 [employer]. This information is not intended to replace advice given to you by your health care provider. Make sure you discuss any questions you have with your health care provider.

## 2014-04-03 NOTE — Progress Notes (Signed)
Patient ID: Stacy Klein, female   DOB: 07/22/1961, 53 y.o.   MRN: [id number]  Chief Complaint  Patient presents with  . Other    discuss port placement    HPI Stacy Klein is a 53 y.o. female.  She is here today for discussion of port placement. 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 She is recovering from pneumonia. She has been receiving treatments at [facility] and is now going to [facility], Dr Ma Hillock.  They started chemotherapy treatments today.  HPI  Past Medical History  Diagnosis Date  . Breast cancer 2010    metastatic  . Diabetes mellitus without complication   . Hypertension   . Anxiety     Past Surgical History  Procedure Laterality Date  . Bilateral total mastectomy with axillary lymph node dissection  2010    radiation to the left chest  . Abdominal hysterectomy    . Joint replacement  2008    knee  . Hernia repair  03-29-12    Dr Jamal Collin  . Lymph node dissection  2010  . Portacath placement  2010  . Port-a-cath removal  2012    History reviewed. No pertinent family history.  Social History History  Substance Use Topics  . Smoking status: Former Smoker -- 3 years    Quit date: 07/25/2008  . Smokeless tobacco: Not on file  . Alcohol Use: No    No Known Allergies  Current Outpatient Prescriptions  Medication Sig Dispense Refill  . amoxicillin (AMOXIL) 875 MG tablet Take 875 mg by mouth 2 (two) times daily.      Marland Kitchen atenolol (TENORMIN) 50 MG tablet Take 50 mg by mouth daily.       . busPIRone (BUSPAR) 30 MG tablet Take 30 mg by mouth 2 (two) times daily.       Marland Kitchen dexamethasone (DECADRON) 4 MG tablet Take 4 mg by mouth 2 (two) times daily.      Marland Kitchen exemestane (AROMASIN) 25 MG tablet Take 25 mg by mouth daily after breakfast.      . gabapentin (NEURONTIN) 400 MG capsule Take 300 mg by mouth 3 (three) times daily.       . hydrochlorothiazide (HYDRODIURIL) 25 MG tablet Take 25 mg by mouth daily.      Marland Kitchen lovastatin (MEVACOR) 40 MG  tablet Take 40 mg by mouth at bedtime.       . metFORMIN (GLUCOPHAGE) 500 MG tablet Take 1,000 mg by mouth 2 (two) times daily with a meal.       . omeprazole (PRILOSEC) 20 MG capsule Take 20 mg by mouth daily.      . ondansetron (ZOFRAN-ODT) 4 MG disintegrating tablet Take 4 mg by mouth every 8 (eight) hours as needed.       Marland Kitchen oxyCODONE-acetaminophen (PERCOCET) 10-325 MG per tablet Take 1 tablet by mouth every 8 (eight) hours as needed.       Marland Kitchen PARoxetine (PAXIL) 40 MG tablet Take 40 mg by mouth every morning.       . polyethylene glycol powder (GLYCOLAX/MIRALAX) powder Take by mouth.      Marland Kitchen tiZANidine (ZANAFLEX) 4 MG tablet Take 4 mg by mouth 2 (two) times daily.        No current facility-administered medications for this visit.    Review of Systems Review of Systems  Constitutional: Negative.   Respiratory: Positive for cough.   Cardiovascular: Negative.     Blood pressure 118/80, pulse 76, resp.  rate 12, height 5\' 7"  (1.702 m), weight 277 lb (125.646 kg).  Physical Exam Physical Exam  Constitutional: She is oriented to person, place, and time. She appears well-developed and well-nourished.  Eyes: No scleral icterus.  Neck: Neck supple.  Cardiovascular: Normal rate, regular rhythm and normal heart sounds.   Pulmonary/Chest: Effort normal.  Right lung scattered and coarse clears with coughing  Abdominal: She exhibits distension.  Neurological: She is alert and oriented to person, place, and time.  Skin: Skin is warm and dry.    Data Reviewed Office notes.  Assessment    Metastatic breast cancer. Oncologist has requested a port. Pt is familiar with is device. Discussed fully and she is agreeable.    Plan    The risks associated with central venous access including arterial, pulmonary and venous injury were reviewed. The possible need for additional treatment if pulmonary injury occurs (chest tube placement) was discussed.       Misty Rago G 04/03/2014, 4:25  PM

## 2014-04-04 ENCOUNTER — Other Ambulatory Visit: Payer: Self-pay | Admitting: Radiation Therapy

## 2014-04-04 DIAGNOSIS — C7949 Secondary malignant neoplasm of other parts of nervous system: Principal | ICD-10-CM

## 2014-04-04 DIAGNOSIS — C7931 Secondary malignant neoplasm of brain: Secondary | ICD-10-CM

## 2014-04-14 ENCOUNTER — Ambulatory Visit: Payer: Self-pay | Admitting: General Surgery

## 2014-04-14 DIAGNOSIS — C50919 Malignant neoplasm of unspecified site of unspecified female breast: Secondary | ICD-10-CM

## 2014-04-15 ENCOUNTER — Ambulatory Visit
Admission: RE | Admit: 2014-04-15 | Discharge: 2014-04-15 | Disposition: A | Discharge status: Home / Self Care | Payer: Medicare Other | Source: Ambulatory Visit | Attending: Radiation Oncology | Admitting: Radiation Oncology

## 2014-04-15 DIAGNOSIS — C7931 Secondary malignant neoplasm of brain: Secondary | ICD-10-CM

## 2014-04-15 DIAGNOSIS — C7949 Secondary malignant neoplasm of other parts of nervous system: Principal | ICD-10-CM

## 2014-04-15 LAB — CBC CANCER CENTER
BASOS PCT: 0.2 %
Basophil #: 0 x10 3/mm (ref 0.0–0.1)
EOS ABS: 0 x10 3/mm (ref 0.0–0.7)
Eosinophil %: 0 %
HCT: 43.2 % (ref 35.0–47.0)
HGB: 13.9 g/dL (ref 12.0–16.0)
LYMPHS ABS: 0.5 x10 3/mm — AB (ref 1.0–3.6)
Lymphocyte %: 9.2 %
MCH: 27.4 pg (ref 26.0–34.0)
MCHC: 32.1 g/dL (ref 32.0–36.0)
MCV: 85 fL (ref 80–100)
Monocyte #: 1 x10 3/mm — ABNORMAL HIGH (ref 0.2–0.9)
Monocyte %: 17.9 %
Neutrophil #: 4.1 x10 3/mm (ref 1.4–6.5)
Neutrophil %: 72.7 %
Platelet: 136 x10 3/mm — ABNORMAL LOW (ref 150–440)
RBC: 5.06 10*6/uL (ref 3.80–5.20)
RDW: 16.9 % — ABNORMAL HIGH (ref 11.5–14.5)
WBC: 5.7 x10 3/mm (ref 3.6–11.0)

## 2014-04-15 LAB — HEPATIC FUNCTION PANEL A (ARMC)
ALT: 204 U/L — AB
Albumin: 2.3 g/dL — ABNORMAL LOW (ref 3.4–5.0)
Alkaline Phosphatase: 634 U/L — ABNORMAL HIGH
BILIRUBIN DIRECT: 1.9 mg/dL — AB (ref 0.00–0.20)
Bilirubin,Total: 2.3 mg/dL — ABNORMAL HIGH (ref 0.2–1.0)
SGOT(AST): 253 U/L — ABNORMAL HIGH (ref 15–37)
Total Protein: 5.6 g/dL — ABNORMAL LOW (ref 6.4–8.2)

## 2014-04-15 LAB — BASIC METABOLIC PANEL
ANION GAP: 11 (ref 7–16)
BUN: 16 mg/dL (ref 7–18)
CREATININE: 1 mg/dL (ref 0.60–1.30)
Calcium, Total: 8.4 mg/dL — ABNORMAL LOW (ref 8.5–10.1)
Chloride: 96 mmol/L — ABNORMAL LOW (ref 98–107)
Co2: 26 mmol/L (ref 21–32)
EGFR (Non-African Amer.): >60
GLUCOSE: 296 mg/dL — AB (ref 65–99)
Osmolality: 279 (ref 275–301)
Potassium: 3.2 mmol/L — ABNORMAL LOW (ref 3.5–5.1)
SODIUM: 133 mmol/L — AB (ref 136–145)

## 2014-04-15 MED ORDER — GADOBENATE DIMEGLUMINE 529 MG/ML IV SOLN
20.0000 mL | Freq: Once | INTRAVENOUS | Status: AC | PRN
Start: 2014-04-15 — End: 2014-04-15
  Administered 2014-04-15: 20 mL via INTRAVENOUS

## 2014-04-16 ENCOUNTER — Ambulatory Visit: Admission: RE | Admit: 2014-04-16 | Payer: Medicare Other | Source: Ambulatory Visit | Admitting: Radiation Oncology

## 2014-04-16 ENCOUNTER — Ambulatory Visit
Admission: RE | Admit: 2014-04-16 | Discharge: 2014-04-16 | Disposition: A | Discharge status: Home / Self Care | Payer: Medicare Other | Source: Ambulatory Visit | Attending: Radiation Oncology | Admitting: Radiation Oncology

## 2014-04-16 ENCOUNTER — Ambulatory Visit: Admission: RE | Admit: 2014-04-16 | Payer: Medicare Other | Source: Ambulatory Visit

## 2014-04-16 ENCOUNTER — Encounter: Payer: Self-pay | Admitting: General Surgery

## 2014-04-16 DIAGNOSIS — Z510 Encounter for antineoplastic radiation therapy: Secondary | ICD-10-CM | POA: Insufficient documentation

## 2014-04-16 DIAGNOSIS — C7931 Secondary malignant neoplasm of brain: Secondary | ICD-10-CM | POA: Insufficient documentation

## 2014-04-16 DIAGNOSIS — C50919 Malignant neoplasm of unspecified site of unspecified female breast: Secondary | ICD-10-CM | POA: Insufficient documentation

## 2014-04-16 DIAGNOSIS — C7949 Secondary malignant neoplasm of other parts of nervous system: Secondary | ICD-10-CM

## 2014-04-16 LAB — CANCER ANTIGEN 27.29: CA 27.29: 4174.7 U/mL — ABNORMAL HIGH (ref 0.0–38.6)

## 2014-04-16 NOTE — Progress Notes (Signed)
Patient gave name and dob as identification, jaundice in eyes, no c/o pain, no appetite, drank little coffee and stayed down, I can't eat much, I had calabash shrimp ,it didn't stay down, nauseated, has zofran 4mg  odt prn,  She started decadron 4mg  bid, no thrush seen on tongue or back of mouth,  No pain or head ache or nausea at present time, asked where  we would be treating  Her  For brain mets,,patient stated back of my head left side" asked patient if she took her metformin this am"Yes, I took it"  Then infomred patient we wouldn't be able to start an IV, patient has a port right subclavian, swelling at site, , didn't feel any bumps, asked if it was a power port? It was placed at [facility] by Dr. Kellie Shropshire on Monday at [facility]" stated sister, , they stated what was IV for anyway?,wasn't informed  Of IV Contrast, ", explained IV contrast is to light up the area of where the treatment will be administered, and since patient took metformin that would interfere with her kidney function , then patient stated,"Oh, I think I didn't take that this am after all" but concerned patient with flat affect and not really sure if she took the medicine or not will have to reschedule the SRS,  CT Sim, will inform Dr.Moody, asked Patric Dykes, RN supervisor to tlk with patient and  Sister, and JenniferChavis, came also to speak with the patient and sister 9:51 AM

## 2014-04-18 ENCOUNTER — Ambulatory Visit
Admission: RE | Admit: 2014-04-18 | Discharge: 2014-04-18 | Disposition: A | Discharge status: Home / Self Care | Payer: Medicare Other | Source: Ambulatory Visit | Attending: Radiation Oncology | Admitting: Radiation Oncology

## 2014-04-18 VITALS — BP 119/84 | HR 62 | Temp 98.1°F | Resp 16

## 2014-04-18 DIAGNOSIS — C7949 Secondary malignant neoplasm of other parts of nervous system: Secondary | ICD-10-CM | POA: Diagnosis not present

## 2014-04-18 DIAGNOSIS — C7931 Secondary malignant neoplasm of brain: Secondary | ICD-10-CM

## 2014-04-18 DIAGNOSIS — C50919 Malignant neoplasm of unspecified site of unspecified female breast: Secondary | ICD-10-CM | POA: Diagnosis not present

## 2014-04-18 DIAGNOSIS — Z510 Encounter for antineoplastic radiation therapy: Secondary | ICD-10-CM | POA: Diagnosis present

## 2014-04-18 MED ORDER — SODIUM CHLORIDE 0.9 % IJ SOLN
10.0000 mL | Freq: Once | INTRAMUSCULAR | Status: AC
  Administered 2014-04-18: 10 mL via INTRAVENOUS

## 2014-04-18 MED ORDER — HEPARIN SOD (PORK) LOCK FLUSH 100 UNIT/ML IV SOLN
500.0000 [IU] | Freq: Once | INTRAVENOUS | Status: AC
  Administered 2014-04-18: 500 [IU] via INTRAVENOUS

## 2014-04-18 NOTE — Progress Notes (Signed)
Patient arrived via w/c, female family member with her, patient gave name,dob as identification, groggy, stated she hasn't taken her metformin since yesterday, gave purple power port card ,was implanted on 04/14/14 , showed card to [facility] supervisor, took copy will have scanned into patient's chart here, applied emla cream over port site,, asked Dr.moody to se patient before she takes her own Ativan, vitals,wnl,94% room air sats, no pain or nausea stated,  BUN on 04/15/14 16, CR=1.0, consent signed by patient, then patient given her ativan po ,swallowed without difficulty, lab Rx for BUN and Cr written by Dr.Moody and given to family member ,she can get her labs on Sunday and if bun and cr good can restart her metformin. Using sterile terchnique accessed right power port x 1 attempt, 20g !inch power port a cath huber needle, excellent blood return, flushed with 30ml Normal saline,applied opsite over site, clamped ports,  patient tolerated well, "you did good I didn't even feel that"patient stated, assisted patient up to w/c,Jennifer RT therapist escorted patient to [place], family member went to our waiting room area 10:12 AM

## 2014-04-21 ENCOUNTER — Ambulatory Visit: Payer: Self-pay | Admitting: Radiation Oncology

## 2014-04-21 ENCOUNTER — Ambulatory Visit: Payer: Medicare Other | Admitting: Radiation Oncology

## 2014-04-21 DIAGNOSIS — Z510 Encounter for antineoplastic radiation therapy: Secondary | ICD-10-CM | POA: Diagnosis not present

## 2014-04-21 LAB — BUN: BUN: 13 mg/dL (ref 7–18)

## 2014-04-21 LAB — CREATININE, SERUM: CREATININE: 0.71 mg/dL (ref 0.60–1.30)

## 2014-04-22 ENCOUNTER — Ambulatory Visit
Admission: RE | Admit: 2014-04-22 | Discharge: 2014-04-22 | Disposition: A | Discharge status: Home / Self Care | Payer: Medicare Other | Source: Ambulatory Visit | Attending: Radiation Oncology | Admitting: Radiation Oncology

## 2014-04-22 ENCOUNTER — Encounter: Payer: Self-pay | Admitting: Radiation Oncology

## 2014-04-22 VITALS — BP 98/68 | HR 60 | Temp 97.9°F | Resp 16

## 2014-04-22 DIAGNOSIS — Z510 Encounter for antineoplastic radiation therapy: Secondary | ICD-10-CM | POA: Diagnosis not present

## 2014-04-22 DIAGNOSIS — C7931 Secondary malignant neoplasm of brain: Secondary | ICD-10-CM

## 2014-04-22 DIAGNOSIS — C7949 Secondary malignant neoplasm of other parts of nervous system: Principal | ICD-10-CM

## 2014-04-22 DIAGNOSIS — Z923 Personal history of irradiation: Secondary | ICD-10-CM

## 2014-04-22 HISTORY — DX: Personal history of irradiation: Z92.3

## 2014-04-22 MED ORDER — LORAZEPAM 1 MG PO TABS
1.0000 mg | ORAL_TABLET | Freq: Once | ORAL | Status: AC
  Administered 2014-04-22: 1 mg via ORAL
  Filled 2014-04-22: qty 1

## 2014-04-22 NOTE — Progress Notes (Signed)
Patient gave name and dob as identificatin, gave 1mg  oral ativan x 1 to patient per verbal order verified by Dr.Moody at 1:15pm, patient taken to [place] #! For SRS Brain, p[atient alert, oriented x3, but flat affect Family members with patient Vitals taken room 1 nursing, patient vitals WNL, patient drowsy but answer questions appropriately, , no pain, ni nausea, dizzyness stated, continue to monitor patient 1 hour, warm blanket offered,declined fluids to drink, family at bedside,check vitals 2:25pm 2:36 PM Rechecked patient's vitals, ,wnl, patient resting with eyes closed, but opned them when calling her name, still droqwsy from the [place], son at side, okay to go home via w/c to car per Dr.Moody, , no fever, no c/o blurred vision,nausea, will call if fever, nausea,vomiting occurs at home, instructed patient to go home and rest the rest of the day, eat something and continue to drink fluids, decadron taper given to son discussed by MD ,  3:19 PM

## 2014-04-22 NOTE — Patient Instructions (Signed)
Continue to take Decadron twice a day for 5 days. Then decrease this to once a day for 7 days. Then take one half tablet per day for 7 days. Then discontinue the Decadron.

## 2014-04-22 NOTE — Op Note (Signed)
  Name: Stacy Klein  MRN: [id number]  Date: 04/22/2014   DOB: 1961-03-22  Stereotactic Radiosurgery Operative Note  PRE-OPERATIVE DIAGNOSIS:  Solitary Brain Metastasis  POST-OPERATIVE DIAGNOSIS:  Solitary Brain Metastasis  PROCEDURE:  Stereotactic Radiosurgery  SURGEON:  Charlie Pitter, MD  NARRATIVE: The patient underwent a radiation treatment planning session in the radiation oncology simulation suite under the care of the radiation oncology physician and physicist.  I participated closely in the radiation treatment planning afterwards. The patient underwent planning CT which was fused to 3T high resolution MRI with 1 mm axial slices.  These images were fused on the planning system.  We contoured the gross target volumes and subsequently expanded this to yield the Planning Target Volume. I actively participated in the planning process.  I helped to define and review the target contours and also the contours of the optic pathway, eyes, brainstem and selected nearby organs at risk.  All the dose constraints for critical structures were reviewed and compared to AAPM Task Group 101.  The prescription dose conformity was reviewed.  I approved the plan electronically.    Accordingly, [name] was brought to the TrueBeam stereotactic radiation treatment linac and placed in the custom immobilization mask.  The patient was aligned according to the IR fiducial markers with BrainLab Exactrac, then orthogonal x-rays were used in ExacTrac with the 6DOF robotic table and the shifts were made to align the patient  [name] received stereotactic radiosurgery uneventfully.    The detailed description of the procedure is recorded in the radiation oncology procedure note.  I was present for the duration of the procedure.  DISPOSITION:  Following delivery, the patient was transported to nursing in stable condition and monitored for possible acute effects to be discharged to home in stable condition with  follow-up in one month.  Charlie Pitter, MD 04/22/2014 1:58 PM

## 2014-04-23 DIAGNOSIS — C7931 Secondary malignant neoplasm of brain: Secondary | ICD-10-CM | POA: Insufficient documentation

## 2014-04-23 DIAGNOSIS — Z510 Encounter for antineoplastic radiation therapy: Secondary | ICD-10-CM | POA: Diagnosis not present

## 2014-04-23 DIAGNOSIS — C7949 Secondary malignant neoplasm of other parts of nervous system: Principal | ICD-10-CM

## 2014-04-23 NOTE — Progress Notes (Signed)
  Radiation Oncology         [phone] ________________________________  Name: Stacy Klein MRN: [id number]  Date: 04/22/2014  DOB: 1961/04/20  End of Treatment Note  Diagnosis:   Metastatic breast cancer with brain metastasis     Indication for treatment:  Palliative       Radiation treatment dates:   04/22/2014  Site/dose:    Left parietal 33mm target was treated using 4 Arcs to a prescription dose of 20 Gy. ExacTrac Snap verification was performed for each couch angle.  Narrative: The patient tolerated radiation treatment relatively well.   The patient exhibited no acute toxicity after treatment.  Plan: The patient has completed radiation treatment. The patient will return to radiation oncology clinic for routine followup in one month. I advised the patient to call or return sooner if they have any questions or concerns related to their recovery or treatment. ________________________________  Jodelle Gross, M.D., Ph.D.

## 2014-04-23 NOTE — Progress Notes (Signed)
  Radiation Oncology         [phone] ________________________________  Name: Stacy Klein MRN: [id number]  Date: 04/22/2014  DOB: 01-12-1961   SPECIAL TREATMENT PROCEDURE   3D TREATMENT PLANNING AND DOSIMETRY: The patient's radiation plan was reviewed and approved by Dr. Trenton Gammon from neurosurgery and radiation oncology prior to treatment. It showed 3-dimensional radiation distributions overlaid onto the planning CT/MRI image set. The [facility] for the target structures as well as the organs at risk were reviewed. The documentation of the 3D plan and dosimetry are filed in the radiation oncology EMR.   NARRATIVE: The patient was brought to the TrueBeam stereotactic radiation treatment machine and placed supine on the CT couch. The head frame was applied, and the patient was set up for stereotactic radiosurgery. Neurosurgery was present for the set-up and delivery   SIMULATION VERIFICATION: In the couch zero-angle position, the patient underwent Exactrac imaging using the Brainlab system with orthogonal KV images. These were carefully aligned and repeated to confirm treatment position for each of the isocenters. The Exactrac snap film verification was repeated at each couch angle.   SPECIAL TREATMENT PROCEDURE: The patient received stereotactic radiosurgery to the following targets:  Left parietal 74mm target was treated using 4 Arcs to a prescription dose of 20 Gy. ExacTrac Snap verification was performed for each couch angle.   STEREOTACTIC TREATMENT MANAGEMENT: Following delivery, the patient was transported to nursing in stable condition and monitored for possible acute effects. Vital signs were recorded . The patient tolerated treatment without significant acute effects, and was discharged to home in stable condition.  PLAN: Follow-up in one month.   ------------------------------------------------  Jodelle Gross, MD, PhD

## 2014-04-23 NOTE — Progress Notes (Signed)
  Radiation Oncology         [phone] ________________________________  Name: LARIA GRIMMETT MRN: [id number]  Date: 04/18/2014  DOB: 08-02-1960  SIMULATION AND TREATMENT PLANNING NOTE  DIAGNOSIS:  Metastatic breast cancer  NARRATIVE:  The patient was brought to the [place] suite.  Identity was confirmed.  All relevant records and images related to the planned course of therapy were reviewed.  The patient freely provided informed written consent to proceed with treatment after reviewing the details related to the planned course of therapy. The consent form was witnessed and verified by the simulation staff. Intravenous access was established for contrast administration. Then, the patient was set-up in a stable reproducible supine position for radiation therapy.  A relocatable thermoplastic stereotactic head frame was fabricated for precise immobilization.  CT images were obtained.  Surface markings were placed.  The CT images were loaded into the planning software and fused with the patient's targeting MRI scan.  Then the target and avoidance structures were contoured.  Treatment planning then occurred.  The radiation prescription was entered and confirmed.  I have requested 3D planning  I have requested a DVH of the following structures: Brain stem, brain, left eye, right I, lenses, optic chiasm, target volumes, uninvolved brain, and normal tissue.    PLAN:  The patient will receive 20 Gy in 1  fraction.  ________________________________  Jodelle Gross, MD, PhD

## 2014-04-24 ENCOUNTER — Ambulatory Visit: Payer: Medicare Other | Admitting: General Surgery

## 2014-04-24 ENCOUNTER — Telehealth: Payer: Self-pay | Admitting: *Deleted

## 2014-04-24 ENCOUNTER — Ambulatory Visit: Payer: Self-pay | Admitting: Internal Medicine

## 2014-04-24 ENCOUNTER — Telehealth: Payer: Self-pay | Admitting: Radiation Therapy

## 2014-04-24 NOTE — Telephone Encounter (Signed)
Returned call to [employer] per Mont Dutton in basket note, with patient's liver disease, the ativan could  Make her  Her more  drowsy , ,   ' patient hasn't had any more ativan since the [facility] treatment, she is taking the decadron as scheduled , ", if patient becomes more lethargic and  Confused  Will  call back later this afternoon,I will inform Dr.Moody when he comes in this afternoon after 3pm, Coralyn Mark thanked me for return call 11:14 AM

## 2014-04-24 NOTE — Telephone Encounter (Signed)
Stacy Klein called to say that Stacy Klein started acting strange yesterday. She is very drowsy and acting as if she has taken Ativan but she has not. Stacy Klein is unable to be left alone, and not able to stand on her own.  She is still taking 4 mg Dexamethasone bid and is scheduled to decrease her dose to 2 mg once a day on Monday. Her chemo was cancelled yesterday because they could not get her to the appointment. Stacy Klein and Stacy Klein's son is concerned about the change in her behavior stating that this is the way she was acting when they found out she had a brain tumor.   Terry's call back number- she is with [facility] right now-- [phone].   Stacy Klein's number - [phone]  JASNK

## 2014-04-25 ENCOUNTER — Inpatient Hospital Stay: Payer: Self-pay | Admitting: Internal Medicine

## 2014-04-25 LAB — COMPREHENSIVE METABOLIC PANEL
ANION GAP: 12 (ref 7–16)
AST: 136 U/L — AB (ref 15–37)
Albumin: 2.2 g/dL — ABNORMAL LOW (ref 3.4–5.0)
Alkaline Phosphatase: 621 U/L — ABNORMAL HIGH
BUN: 12 mg/dL (ref 7–18)
Bilirubin,Total: 3.1 mg/dL — ABNORMAL HIGH (ref 0.2–1.0)
CALCIUM: 7.6 mg/dL — AB (ref 8.5–10.1)
CHLORIDE: 95 mmol/L — AB (ref 98–107)
Co2: 26 mmol/L (ref 21–32)
Creatinine: 0.89 mg/dL (ref 0.60–1.30)
EGFR (African American): >60
EGFR (Non-African Amer.): >60
GLUCOSE: 261 mg/dL — AB (ref 65–99)
Osmolality: 275 (ref 275–301)
Potassium: 3 mmol/L — ABNORMAL LOW (ref 3.5–5.1)
SGPT (ALT): 136 U/L — ABNORMAL HIGH
Sodium: 133 mmol/L — ABNORMAL LOW (ref 136–145)
Total Protein: 5.4 g/dL — ABNORMAL LOW (ref 6.4–8.2)

## 2014-04-25 LAB — CBC
HCT: 37.8 % (ref 35.0–47.0)
HGB: 11.9 g/dL — AB (ref 12.0–16.0)
MCH: 27.1 pg (ref 26.0–34.0)
MCHC: 31.5 g/dL — ABNORMAL LOW (ref 32.0–36.0)
MCV: 86 fL (ref 80–100)
PLATELETS: 67 10*3/uL — AB (ref 150–440)
RBC: 4.39 10*6/uL (ref 3.80–5.20)
RDW: 17.3 % — AB (ref 11.5–14.5)
WBC: 5.5 10*3/uL (ref 3.6–11.0)

## 2014-04-25 LAB — TROPONIN I: Troponin-I: 0.06 ng/mL — ABNORMAL HIGH

## 2014-04-25 LAB — LIPASE, BLOOD: LIPASE: 85 U/L (ref 73–393)

## 2014-04-26 LAB — URINALYSIS, COMPLETE
BLOOD: NEGATIVE
Bacteria: NONE SEEN
Glucose,UR: >=500 mg/dL (ref 0–75)
Ketone: NEGATIVE
Leukocyte Esterase: NEGATIVE
Nitrite: NEGATIVE
Ph: 6 (ref 4.5–8.0)
RBC,UR: <1 /HPF (ref 0–5)
SPECIFIC GRAVITY: 1.029 (ref 1.003–1.030)
SQUAMOUS EPITHELIAL: NONE SEEN
WBC UR: 4 /HPF (ref 0–5)

## 2014-04-26 LAB — CBC WITH DIFFERENTIAL/PLATELET
BASOS ABS: 0 10*3/uL (ref 0.0–0.1)
Basophil %: 0.2 %
EOS ABS: 0 10*3/uL (ref 0.0–0.7)
Eosinophil %: 0 %
HCT: 38.4 % (ref 35.0–47.0)
HGB: 12.6 g/dL (ref 12.0–16.0)
LYMPHS ABS: 0.6 10*3/uL — AB (ref 1.0–3.6)
Lymphocyte %: 15.1 %
MCH: 27.8 pg (ref 26.0–34.0)
MCHC: 32.7 g/dL (ref 32.0–36.0)
MCV: 85 fL (ref 80–100)
MONOS PCT: 10.3 %
Monocyte #: 0.4 x10 3/mm (ref 0.2–0.9)
Neutrophil #: 2.8 10*3/uL (ref 1.4–6.5)
Neutrophil %: 74.4 %
Platelet: 61 10*3/uL — ABNORMAL LOW (ref 150–440)
RBC: 4.52 10*6/uL (ref 3.80–5.20)
RDW: 17.2 % — ABNORMAL HIGH (ref 11.5–14.5)
WBC: 3.8 10*3/uL (ref 3.6–11.0)

## 2014-04-26 LAB — BASIC METABOLIC PANEL
ANION GAP: 6 — AB (ref 7–16)
BUN: 14 mg/dL (ref 7–18)
CALCIUM: 7.2 mg/dL — AB (ref 8.5–10.1)
CHLORIDE: 101 mmol/L (ref 98–107)
CO2: 30 mmol/L (ref 21–32)
Creatinine: 0.63 mg/dL (ref 0.60–1.30)
EGFR (Non-African Amer.): >60
Glucose: 296 mg/dL — ABNORMAL HIGH (ref 65–99)
Osmolality: 285 (ref 275–301)
Potassium: 3.7 mmol/L (ref 3.5–5.1)
SODIUM: 137 mmol/L (ref 136–145)

## 2014-04-26 LAB — MAGNESIUM: Magnesium: 2 mg/dL

## 2014-04-27 LAB — COMPREHENSIVE METABOLIC PANEL
ALBUMIN: 1.9 g/dL — AB (ref 3.4–5.0)
Alkaline Phosphatase: 524 U/L — ABNORMAL HIGH
Anion Gap: 7 (ref 7–16)
BUN: 16 mg/dL (ref 7–18)
Bilirubin,Total: 1.8 mg/dL — ABNORMAL HIGH (ref 0.2–1.0)
CALCIUM: 7.4 mg/dL — AB (ref 8.5–10.1)
CREATININE: 0.61 mg/dL (ref 0.60–1.30)
Chloride: 102 mmol/L (ref 98–107)
Co2: 26 mmol/L (ref 21–32)
EGFR (African American): >60
GLUCOSE: 348 mg/dL — AB (ref 65–99)
Osmolality: 285 (ref 275–301)
POTASSIUM: 4.3 mmol/L (ref 3.5–5.1)
SGOT(AST): 120 U/L — ABNORMAL HIGH (ref 15–37)
SGPT (ALT): 103 U/L — ABNORMAL HIGH
Sodium: 135 mmol/L — ABNORMAL LOW (ref 136–145)
TOTAL PROTEIN: 4.7 g/dL — AB (ref 6.4–8.2)

## 2014-04-27 LAB — AMMONIA: AMMONIA, PLASMA: 120 umol/L — AB (ref 11–32)

## 2014-04-28 LAB — BASIC METABOLIC PANEL
Anion Gap: 10 (ref 7–16)
BUN: 16 mg/dL (ref 7–18)
CHLORIDE: 104 mmol/L (ref 98–107)
Calcium, Total: 7.4 mg/dL — ABNORMAL LOW (ref 8.5–10.1)
Co2: 22 mmol/L (ref 21–32)
Creatinine: 0.71 mg/dL (ref 0.60–1.30)
EGFR (African American): >60
EGFR (Non-African Amer.): >60
GLUCOSE: 283 mg/dL — AB (ref 65–99)
Osmolality: 283 (ref 275–301)
Potassium: 4.5 mmol/L (ref 3.5–5.1)
SODIUM: 136 mmol/L (ref 136–145)

## 2014-04-28 LAB — CBC WITH DIFFERENTIAL/PLATELET
BASOS ABS: 0 10*3/uL (ref 0.0–0.1)
Basophil %: 0.1 %
EOS PCT: 0 %
Eosinophil #: 0 10*3/uL (ref 0.0–0.7)
HCT: 41.1 % (ref 35.0–47.0)
HGB: 13.3 g/dL (ref 12.0–16.0)
LYMPHS ABS: 0.7 10*3/uL — AB (ref 1.0–3.6)
Lymphocyte %: 8.6 %
MCH: 27.7 pg (ref 26.0–34.0)
MCHC: 32.2 g/dL (ref 32.0–36.0)
MCV: 86 fL (ref 80–100)
MONO ABS: 0.8 x10 3/mm (ref 0.2–0.9)
Monocyte %: 9.2 %
NEUTROS ABS: 7 10*3/uL — AB (ref 1.4–6.5)
NEUTROS PCT: 82.1 %
Platelet: 56 10*3/uL — ABNORMAL LOW (ref 150–440)
RBC: 4.78 10*6/uL (ref 3.80–5.20)
RDW: 17.3 % — ABNORMAL HIGH (ref 11.5–14.5)
WBC: 8.5 10*3/uL (ref 3.6–11.0)

## 2014-04-29 ENCOUNTER — Ambulatory Visit: Payer: Self-pay | Admitting: Internal Medicine

## 2014-04-29 LAB — CBC CANCER CENTER
BASOS PCT: 0.1 %
Basophil #: 0 x10 3/mm (ref 0.0–0.1)
Eosinophil #: 0.2 x10 3/mm (ref 0.0–0.7)
Eosinophil %: 1.1 %
HCT: 47.4 % — AB (ref 35.0–47.0)
HGB: 15.2 g/dL (ref 12.0–16.0)
LYMPHS ABS: 0.9 x10 3/mm — AB (ref 1.0–3.6)
LYMPHS PCT: 5.5 %
MCH: 27.3 pg (ref 26.0–34.0)
MCHC: 32.1 g/dL (ref 32.0–36.0)
MCV: 85 fL (ref 80–100)
MONO ABS: 0 x10 3/mm — AB (ref 0.2–0.9)
MONOS PCT: 0.2 %
NEUTROS ABS: 15.8 x10 3/mm — AB (ref 1.4–6.5)
Neutrophil %: 93.1 %
Platelet: 85 x10 3/mm — ABNORMAL LOW (ref 150–440)
RBC: 5.56 10*6/uL — ABNORMAL HIGH (ref 3.80–5.20)
RDW: 17.6 % — ABNORMAL HIGH (ref 11.5–14.5)
WBC: 17 x10 3/mm — ABNORMAL HIGH (ref 3.6–11.0)

## 2014-04-29 LAB — COMPREHENSIVE METABOLIC PANEL
Albumin: 2.2 g/dL — ABNORMAL LOW (ref 3.4–5.0)
Alkaline Phosphatase: 688 U/L — ABNORMAL HIGH
Anion Gap: 10 (ref 7–16)
BUN: 19 mg/dL — ABNORMAL HIGH (ref 7–18)
Bilirubin,Total: 3 mg/dL — ABNORMAL HIGH (ref 0.2–1.0)
CALCIUM: 8.1 mg/dL — AB (ref 8.5–10.1)
Chloride: 98 mmol/L (ref 98–107)
Co2: 23 mmol/L (ref 21–32)
Creatinine: 0.88 mg/dL (ref 0.60–1.30)
EGFR (African American): >60
EGFR (Non-African Amer.): >60
GLUCOSE: 294 mg/dL — AB (ref 65–99)
Osmolality: 276 (ref 275–301)
POTASSIUM: 4.7 mmol/L (ref 3.5–5.1)
SGOT(AST): 158 U/L — ABNORMAL HIGH (ref 15–37)
SGPT (ALT): 131 U/L — ABNORMAL HIGH
Sodium: 131 mmol/L — ABNORMAL LOW (ref 136–145)
TOTAL PROTEIN: 5.2 g/dL — AB (ref 6.4–8.2)

## 2014-05-07 LAB — BASIC METABOLIC PANEL
Anion Gap: 11 (ref 7–16)
BUN: 28 mg/dL — AB (ref 7–18)
CO2: 23 mmol/L (ref 21–32)
Calcium, Total: 8.3 mg/dL — ABNORMAL LOW (ref 8.5–10.1)
Chloride: 98 mmol/L (ref 98–107)
Creatinine: 0.95 mg/dL (ref 0.60–1.30)
EGFR (African American): >60
Glucose: 264 mg/dL — ABNORMAL HIGH (ref 65–99)
Osmolality: 279 (ref 275–301)
Potassium: 4.5 mmol/L (ref 3.5–5.1)
Sodium: 132 mmol/L — ABNORMAL LOW (ref 136–145)

## 2014-05-07 LAB — HEPATIC FUNCTION PANEL A (ARMC)
Albumin: 1.8 g/dL — ABNORMAL LOW (ref 3.4–5.0)
Alkaline Phosphatase: 601 U/L — ABNORMAL HIGH
Bilirubin, Direct: 2.3 mg/dL — ABNORMAL HIGH (ref 0.00–0.20)
Bilirubin,Total: 3.1 mg/dL — ABNORMAL HIGH (ref 0.2–1.0)
SGOT(AST): 102 U/L — ABNORMAL HIGH (ref 15–37)
SGPT (ALT): 78 U/L — ABNORMAL HIGH
Total Protein: 5.2 g/dL — ABNORMAL LOW (ref 6.4–8.2)

## 2014-05-07 LAB — CBC CANCER CENTER
Basophil #: 0 x10 3/mm (ref 0.0–0.1)
Basophil %: 0.1 %
EOS PCT: 0 %
Eosinophil #: 0 x10 3/mm (ref 0.0–0.7)
HCT: 44.8 % (ref 35.0–47.0)
HGB: 14.4 g/dL (ref 12.0–16.0)
Lymphocyte #: 0.6 x10 3/mm — ABNORMAL LOW (ref 1.0–3.6)
Lymphocyte %: 1.9 %
MCH: 27.6 pg (ref 26.0–34.0)
MCHC: 32.2 g/dL (ref 32.0–36.0)
MCV: 86 fL (ref 80–100)
MONOS PCT: 3.2 %
Monocyte #: 0.9 x10 3/mm (ref 0.2–0.9)
Neutrophil #: 27.6 x10 3/mm — ABNORMAL HIGH (ref 1.4–6.5)
Neutrophil %: 94.8 %
PLATELETS: 90 x10 3/mm — AB (ref 150–440)
RBC: 5.23 10*6/uL — ABNORMAL HIGH (ref 3.80–5.20)
RDW: 18.5 % — ABNORMAL HIGH (ref 11.5–14.5)
WBC: 29.2 x10 3/mm — ABNORMAL HIGH (ref 3.6–11.0)

## 2014-05-07 LAB — HEMOGLOBIN A1C: HEMOGLOBIN A1C: 8 % — AB (ref 4.2–6.3)

## 2014-05-19 ENCOUNTER — Encounter: Payer: Self-pay | Admitting: Radiation Oncology

## 2014-05-21 ENCOUNTER — Telehealth: Payer: Self-pay | Admitting: *Deleted

## 2014-05-21 ENCOUNTER — Ambulatory Visit: Admission: RE | Admit: 2014-05-21 | Payer: Medicare Other | Source: Ambulatory Visit | Admitting: Radiation Oncology

## 2014-05-21 HISTORY — DX: Personal history of irradiation: Z92.3

## 2014-05-21 NOTE — Telephone Encounter (Signed)
Left phone msg for patient to call me to reschedule today's missed appointment with Dr. Lisbeth Renshaw

## 2014-05-22 ENCOUNTER — Encounter: Payer: Self-pay | Admitting: *Deleted

## 2014-05-25 ENCOUNTER — Ambulatory Visit: Payer: Self-pay | Admitting: Internal Medicine

## 2014-05-25 DEATH — deceased

## 2014-05-27 ENCOUNTER — Encounter: Payer: Self-pay | Admitting: Radiation Oncology

## 2014-05-29 ENCOUNTER — Ambulatory Visit: Payer: Medicare Other | Attending: Internal Medicine

## 2014-06-13 ENCOUNTER — Encounter: Payer: Self-pay | Admitting: Radiation Therapy

## 2014-06-13 NOTE — Progress Notes (Signed)
Pt expired on 31-May-2014   [name] Stacy Klein, 53, lost her five year battle with breast cancer on May 31, 2014, at her sons' home, surrounded by her family. She was born in [place] to [name]. 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 She was a loving and devoted mother, grandmother, daughter, sister and friend who will be missed dearly.Survivors include her son and daughter-in-law, Stacy Mark "T.J." and Stacy Klein; grandchildren, Stacy Klein, Stacy Klein, Stacy Klein and Stacy Klein; mother and stepfather, Stacy Klein and Stacy Klein; father and stepmother, Stacy Klein and Stacy Klein.; paternal grandmother, Stacy Klein; siblings and spouses, Stacy Klein and Stacy Klein, Stacy Klein, Stacy Klein, Stacy Klein, Stacy and [name], [name]; canine companion, [name]; numerous nieces and nephews.She was preceded in death by her maternal grandparents, [name] and [name]; and paternal grandfather, the 38. [name], Sr.The funeral service will be held at 2 p.m. Wednesday, Oct. 21, 2015, in the [name] by the Rev. [name] with burial to follow in [name]. The family will receive friends from noon to 1:45 p.m. Wednesday prior to the service and at other times at the son's home.Memorials may be made to [name], [place], [place], [place] [address]. - See more [email or web address]

## 2014-11-11 NOTE — Op Note (Signed)
PATIENT NAME:  Stacy Klein, Stacy Klein MR#:  [id number] DATE OF BIRTH:  08-Apr-1961  DATE OF PROCEDURE:  03/29/2012  PREOPERATIVE DIAGNOSIS: Incisional hernia.   POSTOPERATIVE DIAGNOSIS: Incisional hernia.   OPERATION: Repair of incisional hernia with mesh.   SURGEON: Mckinley Jewel, M.D.   ANESTHESIA: General.   COMPLICATIONS: None.  ESTIMATED BLOOD LOSS: Less than 25 mL.  DRAINS: None.   DESCRIPTION OF PROCEDURE: The patient was put to sleep in the supine position on the operating table. The abdomen in the area of the umbilicus and above was prepped and draped out as a sterile field. The hernia was located at the site of a previous port incision which was above the level of the umbilicus. The hernial protrusion in fact was fairly large in size with a fascial defect that was approximately 4 to 5 cm based on the CT findings. A vertical incision was made extending from just above the umbilicus upward and dissected down in the deep subcutaneous tissue where the hernial protrusion was identified. This was then circumferentially freed down to the fascia using cautery for control of bleeding. After this was satisfactorily exposed, the sac was opened and noted to contain a portion of the omentum and one loop of small intestine. None of these were adherent to the sac or to the fascial edges. These were easily reduced. The sac was then circumferentially excised out with the fascial opening which was more transversely oriented, measuring about 4 cm. After this was excised out, the peritoneum was freed from the posterior aspect of the linea alba and the muscle all around and subsequently the peritoneum was closed with a 2-0 Vicryl stitch. An adequate preperitoneal space was then created for placement of a ProLite mesh measuring 6 x 4 cm. Sutures of 0 Prolene were then placed superiorly, inferiorly and in the lateral aspect and the mesh was positioned in the preperitoneal space and the stitches were brought up through the  fascia above, below and on the two sides and tied down. Following this, the fascial opening was closed with interrupted figure-of-eight stitches of 0 Prolene. A satisfactory reinforced repair of the hernial opening was obtained. The wound was irrigated. The subcutaneous tissue was closed in layers with 2-0 and 3-0 Vicryl and the skin closed with subcuticular 4-0 Vicryl reinforced with Steri-Strips and tincture of benzoin. A dry sterile dressing was placed. The patient tolerated the procedure well. There were no immediate problems encountered. She was subsequently extubated and returned to the recovery room in stable condition.   ____________________________ S.Robinette Haines, MD sgs:ap D: 03/30/2012 09:54:34 ET T: 03/30/2012 15:26:53 ET JOB#: [id number]  cc: Synthia Innocent. Jamal Collin, MD, <Dictator> [facility] Robinette Haines MD ELECTRONICALLY SIGNED 03/31/2012 9:46

## 2014-11-15 NOTE — Discharge Summary (Signed)
PATIENT NAME:  Stacy Klein, Stacy Klein MR#:  [id number] DATE OF BIRTH:  1960-11-13  For a detailed note please look at the history and physical done on admission by Dr.  Lunette Stands.  DIAGNOSES AT DISCHARGE: As follows:  1.  Metastatic breast cancer.  2.  Abnormal liver function tests due to metastatic disease.  3.  Abdominal pain and intractable nausea and vomiting due to the metastatic cancer.  4.  Hypertension.  5.  Pneumonia.  6.  Diabetes.  DIET:  The patient is being discharged on a low-sodium, low-fat, American Diabetic Association diet.   ACTIVITIES: As tolerated.   FOLLOWUP: With Dr. Ma Hillock in the next 1 to 2 days.  MEDICATIONS: The patient is being discharged on the following medications: Gabapentin 300 mg t.i.d., lovastatin 40 mg at bedtime, metformin 500 mg 2 tabs b.i.d., paroxetine 40 mg at bedtime, Exemestane 25 mg daily, tizanidine 4 mg 2 tabs at bedtime, Xanax 0.25 mg 1 tab t.i.d. as needed, atenolol 50 mg daily, buspirone 15 mg 2 tabs at bedtime, hydrochlorothiazide 25 mg daily, omeprazole 20 mg daily, Percocet 10/325 one tab q. 6 hours as needed for pain, Zofran 4 mg t.i.d. as needed. promethazine 25 mg 1 to 2 tabs q. 4-6 hours as needed for nausea and vomiting, dexamethasone 4 mg b.i.d., Augmentin 500 mg b.i.d. x 7 days.   [place] COURSE: Sandeep R. Ma Hillock, MD, from oncology; Lucilla Lame, MD, from gastroenterology.   PERTINENT STUDIES DONE DURING THE HOSPITAL COURSE: Are as follows: A CT scan of the abdomen and pelvis done with contrast on admission showing interval progression of multiple hepatic metastatic lesions. Interval development of perihepatic and abdominal ascites. Interval progression of lytic osseous metastasis. Ground-glass and consolidated opacities within the bilateral lower lobes and represented a combination of atelectasis or infection.   The patient had a CT of the head done with and without contrast showing a left parietal lobe, 1.8 cm,  complex calcified lesion. This may be hemorrhagic, but the fluid filled level appears slightly larger than the prior exam. Given the patient's clinical findings, this could be suspicious for intracranial metastatic disease.   HOSPITAL COURSE: This is a 54 year old female who presented to the hospital with abdominal pain, intractable nausea, vomiting, and abnormal liver function tests.   Problem #1:  Abdominal pain and intractable nausea and vomiting. This patient's clinical symptoms were likely secondary to her metastatic disease. The patient's CT scan of the abdomen and pelvis showed extensive metastatic lesions in her liver and also lytic lesions in her bone. The patient was treated supportively with IV pain control along with antiemetics.  She was placed on some scheduled Reglan. After supportive care with fluids and antiemetics and pain control, her clinical symptoms have significantly improved. She has had no further nausea, vomiting in the past 48 hours, and her pain has significantly improved. She is tolerating a regular diet well and therefore is being discharged home.   Problem #2: Abnormal liver function tests. This is likely secondary to metastatic cancer. The patient did not have the evidence of acute biliary pathology to explain her abnormal LFTs. She was seen by gastroenterology who agreed with this and did not think that the patient needed any further intervention.   Problem #3:  Metastatic breast cancer. The patient's prognosis overall is very poor given her recurrent stage IV disease. Given her intractable nausea, vomiting, she underwent a CT scan of the head done with and without contrast, which showed a questionable metastatic intracranial  lesion. She was started on oral Decadron. After being started on Decadron, she has clinically improved. She likely would benefit from palliative radiation therapy and also a bone scan. This is to be done as an outpatient. The patient is scheduled for a  bone scan tomorrow and will follow up with radiation oncology and also with Dr. Ma Hillock as an outpatient.   Problem #4.  Diabetes. The patient was maintained on some sliding scale insulin. She will continue her metformin upon discharge.   Problem #5: Hypertension. She remained hemodynamically stable. She will continue atenolol.  Problem #6:  GERD. The patient was maintained on her Protonix. She will resume that.   Problem #7:  Anxiety. The patient was maintained on BuSpar. She will resume that.  Problem #8:  Pneumonia. The patient was noted to have some cough and shortness of breath on admission.  Her CT chest did show bilateral lower lobe airspace disease. She was empirically started on Zosyn. She has significantly improved since then. She has been afebrile with no white count. Her respiratory symptoms have improved. I am discharging her empirically on some oral Augmentin as stated.   CODE STATUS: The patient is a full code.   DISPOSITION: She is being discharged home with close followup with oncology as an outpatient.   Time spent on discharge was 40 minutes.    ____________________________ Belia Heman. Verdell Carmine, MD vjs:LT D: 03/31/2014 14:40:00 ET T: 03/31/2014 20:39:30 ET JOB#: [id number]  cc: Belia Heman. Verdell Carmine, MD, <Dictator> Sandeep R. Ma Hillock, MD Henreitta Leber MD ELECTRONICALLY SIGNED 04/02/2014 15:55

## 2014-11-15 NOTE — Consult Note (Signed)
ONCOLOGY followup note - sitting in bed, still has dyspnea on exertion and some wheezing. Denies headaches or visual disturbances.appetite is fair. Pain is fairly well-controlled.sitting in bed, alert and oriented) appropriately. No acute distress.           Vitals - 98.3, 88, 18, 112/70, 95% on 2 L           CVS - S1-S2, regular            Lungs- bilateral diminished breath sounds with some wheezing bilaterally.       Neuro - grossly nonfocal, cranial nerves intact. creatinine 0.93, bilirubin 6, alkaline phosphatase 429, ALT 53, AST 346.  03/29/14 - CT scan of the head w/wo contrast: IMPRESSION: Left parietal lobe complex 1.8 cm partially calcified lesion. This may be hemorrhagic with a fluid fluid level and appears slightly larger than on the prior exam. This finding with patient's clinical history is suspicious for intracranial metastatic disease. Contrast-enhanced MR would prove helpful for further delineation if clinically able. Left calvarial 8 mm lucency new since 2012 suspicious for osseous metastatic disease.  Impression/Recommendations: 54 year old female patient with a history of multiple medical problems as described above, including recurrent stage IVmetastatic breast cancer, more recently treatment was changed to Faslodex injections for hormonal therapy of breast cancer in June and she has been on this treatment for about 3 months now. The patient currently admitted with progressive gastrointestinal symptoms, weakness, tachycardia and possible pneumonia. Agree with ongoing supportive treatment and broad-spectrum antibiotic coverage. CT scan of the abdomen and pelvis also shows progression of liver metastasis and bone metastasis. Plan is to change treatment plan and pursue palliative chemotherapy once acute issues improve. Abnormal LFTs likely from involvement of metastatic breast cancer, will continue to monitor this while on chemotherapy. Given hip pain and bone metastasis, will get bone scan  on Tuesday to see if she will need palliative radiation if pain continues to worsen.  CT scan of the head reports 1.8 cm left parietal lobe lesion suspicious for brain metastasis, may be hemorrhagic. No major headaches or neurological symptoms, vomiting is better today since starting Decadron. Continue on Decadron 4 mg by mouth twice a day, will consult radiation oncologist Dr.Chrystal when he comes back on Tuesday. Given brain lesion with possible hemorrhage, will stop Lovenox and put on SCDs for DVT prophylaxis. Add nebulizer treatments followup dyspnea/wheezing. Have not discussed the CT head findings with the patient per her son's request, wants to wait till Monday if possible. Will continue to follow.    Electronic Signatures: Jonn Shingles (MD)  (Signed on 05-Sep-15 13:40)  Authored  Last Updated: 05-Sep-15 13:40 by Jonn Shingles (MD)

## 2014-11-15 NOTE — Consult Note (Signed)
PATIENT NAME:  Stacy Klein, JACQUES MR#:  [id number] DATE OF BIRTH:  Apr 21, 1961  DATE OF CONSULTATION:  04/26/2014  CONSULTING PHYSICIAN:  Simonne Come. Inez Pilgrim, MD  HISTORY OF PRESENT ILLNESS: Stacy Klein is a 54 year old patient who was admitted on October 2 experiencing nausea. She had apparently vomited x 1, but was better by the time she was in the hospital, with increased weakness for 3 days.   The pertinent history is that the patient has metastatic cancer including liver and brain metastasis, has been on chemotherapy with Halaven for salvage treatment. Last treatment was September 22. Today would be day 19 of this cycle of treatment. Also notably is that 4 days earlier, the patient had her stereotactic radiosurgery treatment of brain metastasis at [facility]. Subsequent to that, she became more sluggish and developed confusion, was not eating or drinking well and had weakness and could not stand on her own. Clinically, apparently this is attributed to increased brain swelling that can follow treatment. The patient was hospitalized, given intravenous fluids. We also gave her an additional dose of Decadron 10 mg IV and then increased her Decadron from 5 mg b.i.d. to 4 mg every 6 hours. The patient had no other acute complaints.   REVIEW OF SYSTEMS: On my system review today, she is improved significantly, was able to stand with assistance and walk the halls with assistance, when yesterday she could not stand. Headache is resolved. Dizziness is resolved. She still has some general weakness. No visual disturbances. No ear or jaw pain. No cough, shortness of breath or wheezing. No retrosternal chest pain or palpitations. Nausea is better. No abdominal pain. Does have abdominal bloating, but has had regular bowel movements. No rash or bruising. Denies focal weakness. Never experienced loss of consciousness or seizure, as per patient and family. Sugars have been running normal to low at home, are elevated  now with the extra Decadron.   PHYSICAL EXAMINATION: GENERAL: The patient is alert and cooperative in no acute distress.  HEENT: Sclerae clear. No thrush in the mouth. The patient did have admission with vaginal yeast infection, not re-examined by me.  NECK: No palpable nodes in the neck.  HEART: Regular.  LUNGS: Clear. No wheezing or rales.  ABDOMEN: Obese, firm, slightly distended, slightly tympanitic.  EXTREMITIES: No edema.  NEUROLOGIC: Grossly nonfocal.   DIAGNOSTIC DATA: A CT of the brain, noncontrast, on admission showed a stable brain lesion. No new lesions. No mass effect. Recent September MRI had shown that was a sole lesion.   LABORATORY DATA: On admission, the sugar was 261. It has been up over 300. Today, the creatinine was 0.89. Sodium was 133. SGPT elevated at 136, SGOT elevated at 136, alkaline phosphatase elevated at 621. Bilirubin was 3.1. Today's white count is 3.8, neutrophils at 2.8, hemoglobin 12.6, platelets 61,000. Troponin was 0.06 yesterday.   IMPRESSION AND PLAN: The patient with widely metastatic breast cancer including brain metastasis, status post 1 treatment of Halaven. Prior tumor marker was also elevated, can be followed, has not yet been repeated. The bilirubin, as stated, is up at 3.2, but bilirubin was 6.6 on September 4 and was 2.3 on September 22.   PLAN: Continue intravenous fluids p.r.n. Diet as tolerated. Continue for now Decadron 4 mg 4 times a day, would titrate down quickly within a day or 2. Watch the blood sugar and cover with sliding scale. Replacement of the potassium has been done. Follow the potassium. Follow liver functions. Would will repeat a  CA 27-29. Nothing else acutely from oncology. Next treatment, if the patient is stable to improved, Halaven would be planned for this upcoming week.     ____________________________ Simonne Come. Inez Pilgrim, MD rgg:TT D: 04/26/2014 13:50:33 ET T: 04/26/2014 18:37:33 ET JOB#: [id number]  cc: Simonne Come. Inez Pilgrim,  MD, <Dictator> Dallas Schimke MD ELECTRONICALLY SIGNED 05/13/2014 13:34

## 2014-11-15 NOTE — H&P (Signed)
PATIENT NAME:  Stacy Klein, DOUGLASS MR#:  [id number] DATE OF BIRTH:  02/10/1961  DATE OF ADMISSION:  04/25/2014  PRIMARY CARE PHYSICIAN: Dr. Janalyn Rouse  REFERRING PHYSICIAN: Verdia Kuba. Paduchowski, MD  CHIEF COMPLAINT: Generalized weakness for 3 days and nausea and vomiting today.   HISTORY OF PRESENT ILLNESS: A 54 year old Caucasian female with a history of breast cancer with metastasis to liver and brain; was sent from home to ED due to the above chief complaint. The patient is alert, awake x2. According to the patient and the patient's sister, the patient was diagnosed with breast cancer with metastasis to brain. The patient had her first radiation 4 days ago. For the past 3 days, the patient has been sluggish, confused and not eating or drinking well. The patient also had generalized weakness, unable to stand on her own. The patient also had some nausea and vomiting today and Dr. Ma Hillock recommended the patient come to the ED for admission. Also, Dr. Inez Pilgrim suggested CAT scan of the head and increased Decadron to 4 mg q. 6 hours.   PAST MEDICAL HISTORY: 1. Metastatic breast cancer to liver and brain.  2. Hypertension.  3. Pneumonia.  4. Diabetes.  5. Hyperlipidemia.  6. Depression. 7. GERD.   PAST SURGICAL HISTORY: Breast cancer status post bilateral mastectomy, cholecystectomy, knee replacement.   ALLERGIES: None.  HOME MEDICATIONS:  1. Xanax 0.25 mg p.o. t.i.d. p.r.n.  2. Tramadol 50 mg p.o. every 6 hours p.r.n.  3. Tizanidine 4 mg 2 tablets once a day at bedtime.  4. Promethazine 25 mg p.o. 1 to 2 tablets every 4 to 6 hours p.r.n. for nausea or vomiting. 5. Potassium 20 mEq p.o. daily.  6. Percocet 10/325 mg p.o. every 6 hours p.r.n. 7. Paroxetine 40 mg p.o. at bedtime.  8. Omeprazole 20 mg p.o. daily.  9. Metformin 500 mg p.o. 2 tablets b.i.d.  10. Lovastatin 40 mg p.o. at bedtime. 11. HCTZ 25 mg p.o. daily.  12. Gabapentin 300 mg p.o. t.i.d.  13. Fluconazole 100 mg p.o. daily.   14. Exemestane 25 mg p.o. daily.  15. Decadron 4 mg p.o. b.i.d. 16. Buspirone 15 mg p.o. 2 tablets at bedtime.  17. Ativan 1 mg p.o. b.i.d.  18. Atenolol 50 mg p.o. daily.   REVIEW OF SYSTEMS: CONSTITUTIONAL: The patient denies any fever or chills but has headache, dizziness and generalized weakness. Unable to stand.  EYES: No double or blurry vision.   EAR, NOSE, THROAT: No postnasal drip, slurred speech or dysphagia.  CARDIOVASCULAR: No chest pain, palpitation, orthopnea, nocturnal dyspnea. No leg edema.  PULMONARY: No cough, sputum, shortness of breath, or hematemesis.  GASTROINTESTINAL: Positive for nausea and vomiting, but no abdominal pain, diarrhea, melena or bloody stool.  GENITOURINARY: No dysuria, hematuria, or incontinence.  SKIN: No rash or jaundice.  NEUROLOGY: No syncope, loss of consciousness, or seizure, but has confusion.  HEMATOLOGY: No easy bruising or bleeding.  ENDOCRINE: No polyuria, polydipsia, heat or cold intolerance.   PHYSICAL EXAMINATION: VITAL SIGNS: Temperature 97.9, blood pressure 129/83, pulse 81, oxygen saturation 94% on room air.  GENERAL: The patient is alert, oriented x2, in no acute distress.  HEENT: Pupils round, equal and reactive to light and accommodation. Moist oral mucosa. Clear oropharynx.  NECK: Supple. No JVD or carotid bruits. No lymphadenopathy. No thyromegaly.  CARDIOVASCULAR: S1, S2 regular rate and rhythm. No murmurs or gallops.  PULMONARY: Bilateral air entry. No wheezing or rales. No use of accessory muscles to breathe.  ABDOMEN: Soft, obese,  bowel sounds present. No distention. Difficult to estimate whether the patient has organomegaly due to obesity.  EXTREMITIES: No edema, clubbing or cyanosis. No calf tenderness. Bilateral pedal pulses present.  SKIN: No rash or jaundice.  NEUROLOGY: A and O x2. Follows commands. No focal deficit. Power 3 out of 5 to 4 out of 5. Sensation intact.   LABORATORY DATA: CAT scan of her brain showed  no intracranial mass effect. There is stable appearance of known metastatic focus in the posterior parietal lobe on the left. No new lesion. Glucose 261, BUN 12, creatinine 0.89, sodium 133, potassium 3, chloride 95, bicarbonate 26, SGPT 136, SGOT 136, alkaline phosphatase 621, bilirubin 3.1. WBC 5.5, hemoglobin 11.9, platelets 67,000. Troponin 0.06. Chest x-ray: Port-A-Cath in good anatomic position. Persistent bilateral mild interstitial prominence.   1. Nausea, vomiting and generalized weakness. Possibly due to side effects of radiation.  2. Breast cancer with metastasis to brain, liver and bone. No liver function tests due to liver metastasis.  3. Hypokalemia.  4. Hyponatremia.  5. Anemia.  6. Thrombocytopenia.   PLAN OF TREATMENT: 1. The patient will be admitted to medical floor. Will start Decadron 4 mg p.o. q. 6 hours according to Dr. Marylene Land recommendation.  2. Will give Zofran p.r.n. for nausea and vomiting, and give potassium supplement for hypokalemia, and follow up BMP.  3. For hyponatremia, will give normal saline with potassium IV and follow up BMP.  4. Diabetes start sliding scale.  5. Hypertension. Continue atenolol, but hold HCTZ due to hypokalemia.  6. I discussed the patient's condition and plan of treatment with the patient and the patient's sister. The patient wants full code.   TIME SPENT: About 65 minutes.    ____________________________ Demetrios Loll, MD qc:lm D: 04/25/2014 21:28:11 ET T: 04/26/2014 02:07:50 ET JOB#: [id number]  cc: Demetrios Loll, MD, <Dictator> Demetrios Loll MD ELECTRONICALLY SIGNED 04/28/2014 13:51

## 2014-11-15 NOTE — Discharge Summary (Signed)
PATIENT NAME:  Stacy Klein, Stacy Klein MR#:  [id number] DATE OF BIRTH:  11-06-1960  DATE OF ADMISSION:  04/25/2014 DATE OF DISCHARGE:  04/28/2014  ADMISSION DIAGNOSES:  1.  Hyponatremia.  2.  Hypokalemia.   DISCHARGE DIAGNOSES:  1.  Electrolyte abnormalities including hypokalemia and hyponatremia from poor p.o. intake and nausea.  2.  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  3.  Abnormal liver function tests in the setting of liver metastasis.  4.  Diabetes, uncontrolled.  5.  Essential hypertension.  6.  Urinary retention.    CONSULTATIONS:  Dr. Inez Pilgrim.    LABORATORY RESULTS:  CT scan on admission showed stable appearance of the known metastatic focus in the posterior parietal lobe on the left, no new lesions are demonstrated.   White blood cells 8.5, hemoglobin 13, hematocrit 42, platelets are 56,000. Sodium 136, potassium 4.5, chloride 104, bicarbonate 22, BUN 16, creatinine 0.71, glucose 283.   HOSPITAL COURSE:  A 54 year old female who was admitted for weakness and found to have hyponatremia and hypokalemia. For further details please refer to the H and P.    1.  Hyponatremia from poor p.o. intake, recent chemotherapy, and radiation, improved with IV fluids.   2.  Hypokalemia again from poor p.o. intake, nausea, and previous IV fluids.  3.  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 CT showed no acute changes. She is status post radiation therapy. Discussed with Dr. Inez Pilgrim, apparently this radiation is supposed to almost get rid of her focused brain metastatic disease. She was initially placed on Decadron 4 mg q. 6 hours. This was tapered to her home dose of b.i.d.  4.  Abnormal LFTs in the setting of the liver metastases. Her ammonia level was elevated. The patient did not appear to be encephalopathic. I started some lactulose 30 b.i.d.  We are holding statin medications. I did ask the pharmacy about the metformin and as per pharmacy okay  to use metformin, just monitoring for renal function.  5.  Diabetes, uncontrolled as the patient was on higher dose steroids. The patient will need outpatient followup.  6.  Essential hypertension. We held the HCTZ due to problem number 1 and 2, but continued atenolol, her blood pressure was acceptable.  7.  Urinary retention, resolved after bowel movements and starting Flomax.   DISCHARGE MEDICATIONS:  1. Flomax 0.4 mg daily.  2. Lactulose 30 mL b.i.d.  3. Gabapentin 300 mg t.i.d.  4. Paxil 40 mg at bedtime.  5. Exemestane 25 mg daily.  6. Tizanidine 4 mg 2 tablets at bedtime.  7. Xanax 0.5 t.i.d. p.r.n.  8. Buspirone 15 mg 2 tablets at bedtime.   9. Nystatin 5 mL 4 times a day.  10. Fluconazole 100 mg daily.  11. Promethazine 25 mg 1-2 tablets q. 4-6 hours p.r.n.  12. Atenolol 50 mg in the morning.  13. Omeprazole 20 mg daily.  14. Tramadol 50 mg q. 6 hours p.r.n.  15. Dexamethasone 4 mg b.i.d.  16. Metformin 1000 b.i.d.   The patient will stop taking lovastatin, HCTZ, Percocet due to the Tylenol component.   DISPOSITION:  Discharge with home health, physical therapy, nurse, and nurse aide for assessment in gait.   DISCHARGE DIET: Low sodium.   DISCHARGE ACTIVITY: As tolerated.   DISCHARGE FOLLOWUP: The patient will follow up with Dr. Inez Pilgrim tomorrow and Dr. Janalyn Rouse in 1 week.    TIME SPENT: Approximately 35 minutes.   The patient was  stable for discharge.    ____________________________ Cailynn Bodnar P. Benjie Karvonen, MD spm:bu D: 04/28/2014 59:29:24 ET T: 04/28/2014 13:49:36 ET JOB#: [id number]  cc: Jhanae Jaskowiak P. Benjie Karvonen, MD, <Dictator> Dr. Loel Dubonnet. Inez Pilgrim, MD Sandeep R. Ma Hillock, MD Donell Beers Awad Gladd MD ELECTRONICALLY SIGNED 04/28/2014 21:11

## 2014-11-15 NOTE — Consult Note (Signed)
ONCOLOGY followup - feels much better. No headaches. Wants to go home today.dyspnea is better. Appetite is fair. Pain is fairly well-controlled.sitting in bed, alert and oriented) appropriately. No acute distress.           Vitals - afebrile, stable           CVS - S1S2, regular            Lungs- bilateral diminished breath sounds with some wheezing bilaterally.           Neuro - grossly nonfocal, cranial nerves intact. creatinine 0.93, bilirubin 6, alkaline phosphatase 429, ALT 53, AST 346.  03/29/14 - CT scan of the head w/wo contrast: IMPRESSION: Left parietal lobe complex 1.8 cm partially calcified lesion. This may be hemorrhagic with a fluid fluid level and appears slightly larger than on the prior exam. This finding with patient's clinical history is suspicious for intracranial metastatic disease. Contrast-enhanced MR would prove helpful for further delineation if clinically able. Left calvarial 8 mm lucency new since 2012 suspicious for osseous metastatic disease.  Impression/Recommendations: 54 year old female patient with a history of multiple medical problems as described above, including recurrent stage IVmetastatic breast cancer, more recently treatment was changed to Faslodex injections for hormonal therapy of breast cancer in June and she has been on this treatment for about 3 months now. The patient currently admitted with progressive gastrointestinal symptoms, weakness, tachycardia and possible pneumonia. Agree with ongoing supportive treatment and broad-spectrum antibiotic coverage. CT scan of the abdomen and pelvis also shows progression of liver metastasis and bone metastasis. Plan is to change treatment plan and pursue palliative chemotherapy once acute issues improve. Abnormal LFTs likely from involvement of metastatic breast cancer, will continue to monitor this while on chemotherapy. Given hip pain and bone metastasis, will get bone scan on Tuesday to see if she will need palliative  radiation if pain continues to worsen.  CT scan of the head reports 1.8 cm left parietal lobe lesion suspicious for brain metastasis, may be hemorrhagic. No major headaches or neurological symptoms, vomiting is better since starting Decadron. Patient and family present were explained about findings on CT head.. Patient being discharged today. Continue on Decadron 4 mg by mouth twice a day, will consult radiation oncologist Dr.Chrystal when she comes back to cancer ctr on Tuesday. Will continue to follow.    Electronic Signatures: Jonn Shingles (MD)  (Signed on 07-Sep-15 23:43)  Authored  Last Updated: 07-Sep-15 23:43 by Jonn Shingles (MD)

## 2014-11-15 NOTE — Op Note (Signed)
PATIENT NAME:  Stacy Klein, Stacy Klein MR#:  [id number] DATE OF BIRTH:  Jul 30, 1960  DATE OF PROCEDURE:  04/14/2014  PREOPERATIVE DIAGNOSIS:  Carcinoma of breast with metastasis.  POSTOPERATIVE DIAGNOSIS: Carcinoma of breast with metastasis.    OPERATION: Insertion of venous access port with ultrasound and fluoroscopic guidance.   SURGEON: Mckinley Jewel, MD  ANESTHESIA: Monitoring care and local anesthetic containing 0.5% Marcaine and 1% Xylocaine.   COMPLICATIONS: None.   ESTIMATED BLOOD LOSS: Minimal.   DRAINS: None.   DESCRIPTION OF PROCEDURE: The patient was placed in the supine position on the operating table with adequate sedation and monitoring. The right neck and chest area were prepped and draped out in a sterile field. The patient was placed in slight Trendelenburg.  Timeout was performed. Ultrasound was used to first with a sterile cover over the probe. The subclavian vein was easily identified beneath the lateral end of the clavicle. Local anesthetic was instilled. A small 1 cm extension was made through this. A needle was successfully positioned in the subclavian vein with free withdrawal of blood. Seldinger technique was then utilized to place a catheter going towards the cavoatrial junction. The skin mark was approximately 18-20 cm. Follow this, the fluoroscopy was used intermittently to ensure the catheter was in good position. The subcutaneous pocket was made over the second costal cartilage area, where local anesthetic was instilled. A skin incision was made and the subcutaneous pocket was done with cautery. The catheter was tunneled through to this site, and cut to an approximate length and fixed to the prefilled port. The port was placed in the pocket and anchored to the underlying fascia with 3 stitches of 2-0 Prolene. Fluoroscopy was repeated one final time to ensure no kinks in the catheter and proper location.   The subcutaneous tissue was closed with 3-0 Vicryl and the skin with  subcuticular 4-0 Vicryl. The port was flushed through with 10 mL of heparinized saline. The patient was subsequently returned to the recovery room in stable condition.    ____________________________ S.Robinette Haines, MD sgs:MT D: 04/14/2014 12:01:50 ET T: 04/14/2014 12:32:35 ET JOB#: [id number]  cc: S.G. Jamal Collin, MD, <Dictator> [facility] Robinette Haines MD ELECTRONICALLY SIGNED 04/21/2014 8:46

## 2014-11-15 NOTE — Consult Note (Signed)
PATIENT NAME:  Stacy Klein, Stacy Klein MR#:  [id number] DATE OF BIRTH:  12-Jun-1961  DATE OF CONSULTATION:  03/28/2014  REFERRING PHYSICIAN: Monica Becton, MD.   CONSULTING PHYSICIAN: Javana Schey R. Ma Hillock, MD.   REASON FOR CONSULTATION: Metastatic breast cancer.   HISTORY OF PRESENT ILLNESS: The patient is a 54 year old female with a past medical history remarkable for hypertension, hyperlipidemia, GERD, depression, stage IV metastatic breast cancer (initially had stage III left breast cancer, status post bilateral mastectomy and chemotherapy at [facility] in 2011. Received treatment on ECoG protocol with Cytoxan/Adriamycin followed by weekly Taxol x 12 plus Avastin. ER/PR positive, HER-2/neu negative. Took anastrozole initially, then changed to exemestane. Metastatic disease was diagnosed on 01/13/2014 by endoscopic ultrasound, mediastinal lymph node biopsy. Treatment was changed to Faslodex on 06/03. She also is on Zometa for bone metastasis). The patient more recently has been having recurrent nausea and vomiting issues and has been following with GI, Dr. Gustavo Lah. Her last EGD on 07/17 reported gastritis, multiple nodules in the stomach and a single gastric polyp. She has now been admitted to the hospital yesterday with continued nausea, vomiting, generalized weakness, tachycardia.   CT scan of the abdomen and pelvis shows progression of multiple hepatic metastases, along with development of perihepatic and abdominal ascites and loculated fluid collection in the gallbladder fossa, biliary leak not excluded, possibly from metastatic lesion. There is also interval progression of lytic bone metastasis and consolidation of bilateral lower lobes. The patient also states she has been having cough with sputum, but no hemoptysis. She has dyspnea on exertion. Liver functions are significantly abnormal with bilirubin of 6.6 and AST up to 396. Today states that she is beginning to feel better. Still has cough. She has  intermittent vomiting, which occurs without warning, but denies any headaches or other neurological symptoms. She did have a CT head without contrast on 07/27, which did show a small focus of increased density in the left posterior parietal region, measuring about 1 cm.   PAST MEDICAL/SURGICAL HISTORY: As in the HPI above.   FAMILY HISTORY: Noncontributory, remarkable for diabetes.   SOCIAL HISTORY: Quit smoking about 40 years ago. Denies alcohol or recreational drug usage.   ALLERGIES: No known drug allergies.   PAST SURGICAL HISTORY: Bilateral mastectomy, cholecystectomy, knee replacement.   HOME MEDICATIONS: Xanax, 0.5 mg 3 times a day p.r.n., Zofran 4 mg every 8 hours p.r.n., Zocor, Phenergan 25 mg every 4 hours p.r.n., hydrochlorothiazide 25 mg daily, Neurontin 300 mg 3 times a day, BuSpar 30 mg daily, lovastatin 40 mg daily, metformin 1000 mg b.i.d., omeprazole 20 mg daily, paroxetine 40 mg daily, exemestane 25 mg daily, tizanidine 8 mg at bedtime, Percocet 10/325 mg every 6 hours p.r.n.   REVIEW OF SYSTEMS:  CONSTITUTIONAL: Progressive generalized weakness. Denies any fever or chills today.  HEENT: No dizziness at rest, epistaxis, ear or jaw pain. There are no sinus symptoms.  CARDIAC: No angina, palpitation, orthopnea, or PND.  LUNGS: Has some dyspnea on exertion, cough, sputum. No hemoptysis.  GASTROINTESTINAL: As in the HPI. No diarrhea or blood in stools.  GENITOURINARY: No dysuria or hematuria.  SKIN: No new rashes or pruritus.  HEMATOLOGIC: No obvious bleeding issues.  MUSCULOSKELETAL: Has hip pain, right more than left, more on ambulation. Intermittent right lower rib pain.  NEUROLOGIC: As in the HPI. No focal weakness or seizures.  ENDOCRINE: No polyuria or polydipsia.   PHYSICAL EXAMINATION: GENERAL: The patient is tired-looking, resting in bed, alert and oriented and converses appropriately. No acute  distress. No icterus.  VITAL SIGNS: 97.7, 82, 20, 133/90, 93% on nasal  cannula oxygen.  HEENT: Normocephalic, atraumatic. Extraocular movements intact. Sclerae anicteric. No oral thrush.  NECK: Negative for lymphadenopathy. CARDIOVASCULAR: S1, S2. Regular rate and rhythm.  LUNGS: Bilateral decreased breath sounds at bases, no rhonchi.  ABDOMEN: Soft, distended mildly but no guarding or rigidity. Mild tenderness in right upper quadrant. Bowel sounds present.  EXTREMITIES: Shows trace edema.  SKIN: No generalized rashes or major bruising.  NEUROLOGIC: Limited exam. Cranial nerves intact. Moves all extremities spontaneously.  MUSCULOSKELETAL: No obvious joint redness or swelling.   LABORATORY DATA: WBC 8800, hemoglobin 13, platelets 166,000, neutrophils 6400. Creatinine 0.81, calcium 7.6. LFTs shows bilirubin 6.6, alkaline phosphatase 467, ALT 53, AST 359, albumin 2.3. Blood culture negative so far.   IMPRESSION AND RECOMMENDATIONS: A 54 year old female patient with a history of multiple medical problems as described above, including recurrent stage IV metastatic breast cancer, more recently treatment was changed to Faslodex injections for hormonal therapy of breast cancer in June and she has been on this treatment for about 3 months now. The patient currently admitted with progressive gastrointestinal symptoms, weakness, tachycardia and possible pneumonia. Agree with ongoing supportive treatment and broad-spectrum antibiotic coverage. CT scan of the abdomen and pelvis also shows progression of liver metastasis and bone metastasis. The patient was explained about this and that we will need to change treatment plan for continued palliation of metastatic breast cancer once acute issues improve.   We will likely need to consider chemotherapy. This will be based upon how her liver functions improve also. She continues to have unexplained vomiting. We will schedule Reglan 4 times daily and also pursue CT scan of the head with and without contrast to look for brain metastasis.  The patient and family are present and have been explained about progressive metastatic disease and the overall prognosis is poor, especially if disease does not respond to further chemotherapy. Given hip pain and bone metastasis, we will get bone scan on Tuesday to see if she will need palliative radiation if pain continues to worsen. We will continue to follow. The patient and family present and are agreeable to this plan.   Thank you for the referral. Please feel free to contact me if any additional questions.      ____________________________ Rhett Bannister Ma Hillock, MD srp:TT D: 03/28/2014 17:46:12 ET T: 03/28/2014 18:45:11 ET JOB#: [id number]  cc: Bryson Palen R. Ma Hillock, MD, <Dictator> Alveta Heimlich MD ELECTRONICALLY SIGNED 03/29/2014 13:44

## 2014-11-15 NOTE — Consult Note (Signed)
Chief Complaint:  Subjective/Chief Complaint STRONGER, WALKS HALLS WITH ASSISTANCE, BATHROOM UNASSISTED, DENIES PAIN   VITAL SIGNS/ANCILLARY NOTES: **Vital Signs.:   04-Oct-15 09:01  Temperature Temperature (F) 97.5  Celsius 36.3  Temperature Source oral  Pulse Pulse 60  Respirations Respirations 18  Systolic BP Systolic BP 383  Diastolic BP (mmHg) Diastolic BP (mmHg) 78  Mean BP 95  Pulse Ox % Pulse Ox % 93  Pulse Ox Activity Level  At rest  Oxygen Delivery Room Air/ 21 %   Brief Assessment:  GEN well developed   Cardiac Regular   Respiratory normal resp effort   Gastrointestinal details normal Nontender   Additional Physical Exam NEURO GROSSLY NON FOCAL   Lab Results: Hepatic:  04-Oct-15 11:03   Bilirubin, Total  1.8  Alkaline Phosphatase  524 (46-116 NOTE: New Reference Range 02/11/14)  SGPT (ALT)  103 (14-63 NOTE: New Reference Range 02/11/14)  SGOT (AST)  120  Total Protein, Serum  4.7  Albumin, Serum  1.9  Routine Chem:  04-Oct-15 11:03   Glucose, Serum  348  BUN 16  Creatinine (comp) 0.61  Sodium, Serum  135  Potassium, Serum 4.3  Chloride, Serum 102  CO2, Serum 26  Calcium (Total), Serum  7.4  Osmolality (calc) 285  eGFR (African American) >60  eGFR (Non-African American) >60 (eGFR values <74m/min/1.73 m2 may be an indication of chronic kidney disease (CKD). Calculated eGFR, using the MRDR Study equation, is useful in  patients with stable renal function. The eGFR calculation will not be reliable in acutely ill patients when serum creatinine is changing rapidly. It is not useful in patients on dialysis. The eGFR calculation may not be applicable to patients at the low and high extremes of body sizes, pregnant women, and vetetarians.)  Result Comment LABS - This specimen was collected through an   - indwelling catheter or arterial line.  - A minimum of 515m of blood was wasted prior    - to collecting the sample.  Interpret  - results with  caution.  Result(s) reported on 27 Apr 2014 at 11:51AM.  Anion Gap 7   Assessment/Plan:  Assessment/Plan:  Assessment STABLE, GENERAL WEAKNESS, METASTATIC CANCER, RECENT CHEMOTX. IMPROVED, AFTER EPISODE OF WEAKNESS AND IMMOBILITY FOLLOWING STEREOTACTIC BRAIN RADIATION. NO SIGN ACTIVE INFECTION. GLU UP SECONDARY TO INCREASED STEROIDS.    PLAN.. Marland KitchenECADRON 4 MG X 3 TODAY, THEN BID START TOMORROW. SLIDING SCALE INSULIN. TEACH FAMILY TO INJECT. WANTS TO GO HOME IN AM. NO SNF. IF MOBILE IN AM AND NO NEW ISSUES, PLAN DISCHARGE WITH HOME HEALTH AND HOME PHYS [place], ALSO DUE FOR CHEMOTX 10/5, LIKELY WILL DELAY UNTIL 10/6   Electronic Signatures: GiDallas SchimkeMD)  (Signed 04-Oct-15 12:42)  Authored: Chief Complaint, VITAL SIGNS/ANCILLARY NOTES, Brief Assessment, Lab Results, Assessment/Plan   Last Updated: 04-Oct-15 12:42 by GiDallas SchimkeMD)

## 2014-11-15 NOTE — H&P (Signed)
PATIENT NAME:  Stacy Klein, Stacy Klein MR#:  [id number] DATE OF BIRTH:  1961-03-28  DATE OF ADMISSION:  03/27/2014  PRIMARY CARE PHYSICIAN: Stacy Salon. Chelminski, MD  REFERRING PHYSICIAN: Toma Aran, MD  CHIEF COMPLAINT: Nausea, vomiting for the last few months, severe generalized weakness.   HISTORY OF PRESENT ILLNESS: [name] is a 54 year old unfortunate female with a history of breast cancer diagnosed in 2010, underwent bilateral mastectomy, was found to have new metastatic lesions in May 2015. The patient was placed on hormone therapy. The patient has been having multiple episodes of vomiting for the last 2 months. The patient has been also followed by GI, Stacy Klein,  underwent EGD, colonoscopy, without any obvious cause. In the last 2 days, the patient was noted to be jaundiced. Concerning this, went to the gastrointestinal diseases who also diagnosed her with pneumonia this morning. Concerning about the jaundice, the patient was referred to Emergency Department for further evaluation. Workup in the Emergency Department with CT abdomen and pelvis showed interval progression of the multiple hepatic metastatic lesions with the development of the perihepatic and abdominal ascites. Was also found to have posterior right hepatic lobe connecting with an overlying loculated fluid collection in the gallbladder foss possible biliary leak cannot be excluded, possibly from the metastatic lesion. Interval progression of the lytic osseous metastasis, consolidation bilateral lower lobes. The patient states she has been having cough with productive sputum, has been having shortness of breath. Has been having significant decreased appetite for a long time. The patient is found to have elevated bilirubin of 6.6 with alkaline phosphatase of 129, AST 396.   PAST MEDICAL HISTORY:  1.  Hypertension.  2.  Hyperlipidemia.  3.  Depression.  4.  gastroesophageal reflux disease. 5.  Breast cancer, status post mastectomy.    PAST SURGICAL HISTORY:  1.  Bilateral mastectomy.  2.  Cholecystectomy.  3.  Knee replacement.   ALLERGIES: No known drug allergies.   HOME MEDICATIONS:  1.   Xanax 0.5 mg 3 times a day as needed.  2.  Zofran 4 mg every 8 hours as needed.  3.  Phenergan 25 mg 1 to 2 tablets every 4 to 6 hours as needed.  4.  Hydrochlorothiazide 25 mg once a day.  5.  Neurontin 300 mg 3 times a day.  6.    50 mg once a day. 7.  BuSpar 30 mg orally once a day.  8.  Lovastatin 40 mg once a day.  9.  Metformin 1000 mg 2 times a day.  10.  Omeprazole 20 mg once a day.  11.  Paroxetine 40 mg once a day.  12.  Exemestane 25 mg once a day.  13.  Tizanidine 8 mg once a day at bedtime.  14.  Percocet 10/325 mg every 6 hours as needed.   SOCIAL HISTORY: Quit smoking about 40 years back. Denies drinking alcohol or using illicit drugs.   FAMILY HISTORY: Denies any history of breast and colon cancer. History of diabetes mellitus.   REVIEW OF SYSTEMS:  CONSTITUTIONAL: Experiencing severe generalized weakness.  EYES: Has scleral icterus. No change in vision.  EARS, NOSE AND THROAT: No change in hearing.  RESPIRATORY: Having cough with shortness of breath and productive sputum.  CARDIOVASCULAR: No chest pain, palpations.  GASTROINTESTINAL: Has nausea, vomiting, abdominal pain.  GENITOURINARY: No dysuria or hematuria.  HEMATOLOGIC: No easy bruising or bleeding.  SKIN: No rash or lesions.  MUSCULOSKELETAL: No joint pains and aches.  NEUROLOGIC: No weakness  or numbness in any part of the body.   PHYSICAL EXAMINATION:  GENERAL: This is a well-built, well-nourished, age-appropriate female lying down in the bed, looks ill looking.  VITAL SIGNS: Temperature 98.1, pulse 112, blood pressure 146/106, respiratory rate of 18, oxygen saturation is 94% on room air.  HEENT: Head normocephalic, atraumatic. Eyes: Scleral icterus. Conjunctivae normal. Pupils equal and reactive. Mucous membranes moist. No pharyngeal  erythema.  NECK: Supple. No lymphadenopathy. No JVD. No carotid bruit.  CHEST: No focal tenderness, bilateral diffuse coarse breath sounds and bilateral wheezing. Decreased breath sounds in the lower lobes.  HEART: S1, S2, regular, tachycardia.  ABDOMEN: Bowel sounds present. Soft. Has tenderness in the right upper quadrant, could not examine the hepatomegaly secondary to pain. No rebound or guarding.  EXTREMITIES: No pedal edema. Pulses 2+.  SKIN: No rash or lesions.  MUSCULOSKELETAL: Good range of motion in all extremities. NEUROLOGICAL: The patient is alert, oriented to place, person and time. Cranial nerves II-XII intact. Motor 5/5 in upper and lower extremities.   LABORATORIES: Troponin less than 0.02. CMP: Total bilirubin 6.6, alkaline phosphatase of 529, AST 396. CBC: WBC of 12.5, hemoglobin 14.6, platelet count of 200,000.   Chest x-ray, one view portable: New bilateral lower lobe opacity.   ASSESSMENT AND PLAN: Stacy Klein is a 54 year old who comes with metastatic breast cancer, including liver, bones, and is also found to have pneumonia.  1.  Nausea, vomiting: Most likely from metastatic liver lesions. Also noted that there was a possible leak of the gallbladder from metastatic lesion. Keep the patient n.p.Klein. for now. Continue with intravenous fluids. Consult gastrointestinal in the morning. Concerning about the patient'Klein elevated liver enzymes, this seems to be more of an intrahepatic blockage causing her to have elevated bilirubin.  2.  Metastatic breast cancer. Consult Dr. Ma Hillock in the morning for further discussion about the plan of care.  3.  Pneumonia. We will treat it as a community-acquired pneumonia for now with Rocephin and Zithromax.  4.  Chronic obstructive pulmonary disease exacerbation: DuoNebs and Solu-Medrol as well as antibiotics.  5.  Diabetes mellitus. We will hold the medications.  6.  Keep the patient on deep vein thrombosis prophylaxis with Lovenox.   TIME  SPENT: Fifty  minutes.    ____________________________ Monica Becton, MD pv:TT D: 03/27/2014 23:12:22 ET T: 03/27/2014 23:50:08 ET JOB#: [id number]  cc: Monica Becton, MD, <Dictator> Stacy Salon. Chelminski, MD Monica Becton MD ELECTRONICALLY SIGNED 03/29/2014 20:59

## 2014-11-15 NOTE — Consult Note (Signed)
PATIENT NAME:  Stacy Klein, Stacy Klein MR#:  [id number] DATE OF BIRTH:  1961-05-26  DATE OF CONSULTATION:  03/29/2014  CONSULTING PHYSICIAN:  Lucilla Lame, MD  CONSULTING SERVICE: Gastroenterology.   REASON FOR CONSULTATION: Nausea, vomiting.   HISTORY OF PRESENT ILLNESS: This patient is a 54 year old woman who has metastatic breast cancer who has chronic nausea and vomiting with multiple workups by GI in the past. The patient had an upper endoscopy by Dr. Gustavo Lah in 2012, had another upper endoscopy in July of this year. At the most recent upper endoscopy, the patient was found to have gastritis and nodules with a single gastric polyp. The patient's family states that she had ulcers, although no ulcers were reported at the endoscopy. The patient had an EOS for adenopathy on a CT scan back in June of this year. The patient also underwent a CT scan of the head this admission that showed findings consistent with metastatic disease to the brain. I am now being asked to see this patient who has metastatic breast cancer with nausea and vomiting and generalized weakness. The patient is quite lethargic today when I went to see her.   PAST MEDICAL HISTORY: Hypertension, hyperlipidemia, depression, gastric reflux, breast cancer status post mastectomies with metastatic lesions now.   ALLERGIES: No known drug allergies.   HOME MEDICATIONS:  1. Xanax.  2. Zofran.  3. Phenergan.  4. Hydrochlorothiazide.  5. Neurontin  6. BuSpar.  7. Lovastatin. 8. Metformin.  9. Omeprazole. 10. Paxil.  11. Exemestane. 12. Percocet. 13. Tizanidine.  SOCIAL HISTORY: Smoked, quit smoking about 40 years ago. No alcohol or drugs.   FAMILY HISTORY: Noncontributory.   REVIEW OF SYSTEMS: A 10-point review of systems is negative except what was stated above.   PHYSICAL EXAMINATION:  GENERAL: The patient answered questions appropriately, orientated x 3 but not alert. She seemed somewhat lethargic.  VITAL SIGNS: Temperature  98.7, pulse 66, respirations 19, blood pressure 109/75, pulse oximetry 94%.  HEENT: Normocephalic, atraumatic. Extraocular motor intact. Pupils equally round and reactive to light and accommodation without JVD, without lymphadenopathy.  LUNGS: Clear to auscultation bilaterally.  HEART: Regular rate and rhythm without murmurs, rubs, or gallops.  ABDOMEN: Distended, soft, without rebound, without guarding.  EXTREMITIES: Without cyanosis, clubbing, or edema.  NEUROLOGICAL: The patient lethargic otherwise nonfocal.  SKIN: Without any rashes or lesions.  ANCILLARY SERVICES: The patient's CT scan as mentioned above.   Liver enzymes elevated with a total bilirubin of 6, alkaline phosphatase of 429, AST 346, ALT 53 with calcium low at 7.3 today.   ASSESSMENT AND PLAN: This patient is a 54 year old woman with metastatic breast disease with a poor prognosis who has had a workup multiple times for her nausea and vomiting. The patient's family was in the room and states that nothing has ever been found to explain the patient's nausea and vomiting. They believe it may be due to her not being on her PPI recently. No further gastrointestinal workup is indicated as the nausea and vomiting is likely related to her metabolic issues with the metastatic breast cancer. I have discussed this with the family, who agree that they also do not want any further workup of the patient for the nausea at this time. Thank you very much for involving the care of this patient. If you have any questions, please do not hesitate to call.    ____________________________ Lucilla Lame, MD dw:lt D: 03/29/2014 17:27:33 ET T: 03/29/2014 19:20:51 ET JOB#: [id number]  cc: Lucilla Lame, MD, <Dictator> Darnise Montag  [facility] MD ELECTRONICALLY SIGNED 04/01/2014 17:57

## 2015-07-17 IMAGING — CT CT CHEST-ABD-PELV W/ CM
1 of 3 series · 12 of 30 positions shown, 18 images · non-contrast
Comparison: none

REASON FOR EXAM: Lt Lower Abd Pain Abn LFT Hx Breast CA Eval Mets
COMMENTS:

PROCEDURE:     CT  - CT CHEST ABDOMEN AND PELVIS W  - May 22, 2013  [DATE]
RESULT:     History: Pain. Breast cancer.
Comparison Study: CT abdomen 03/20/2012.

[Series 2: soft tissue · axial · 0.80mm/px · z∈[+512,+1043]mm · 12 of 217 slices shown, 18 images]
[im 20/217  mediastinal]
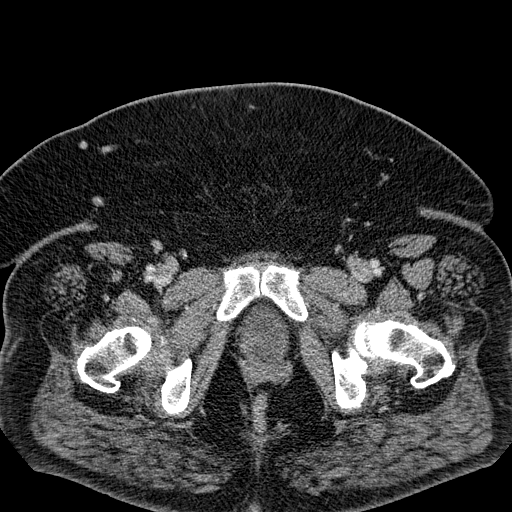
[im 20/217  bone]
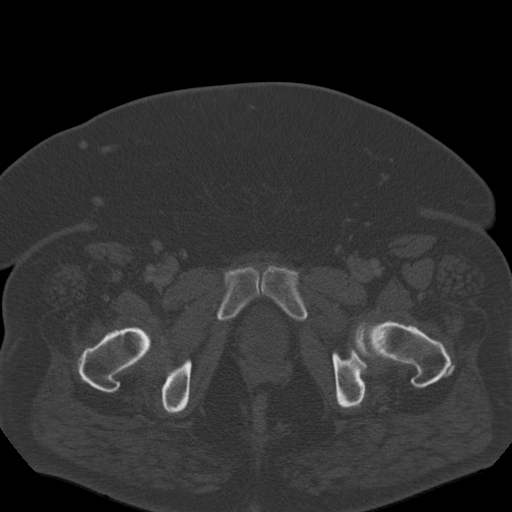
[im 40/217  mediastinal]
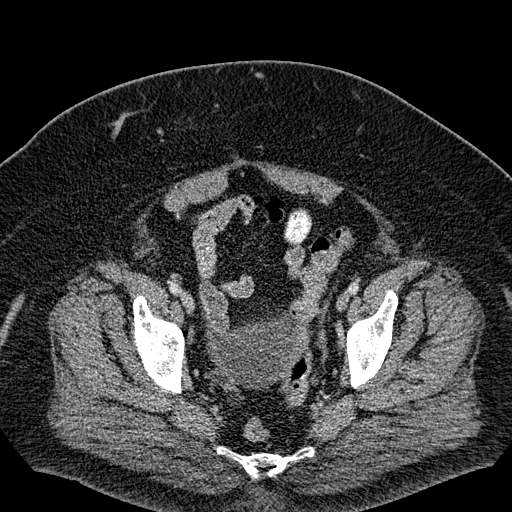
[im 59/217  mediastinal]
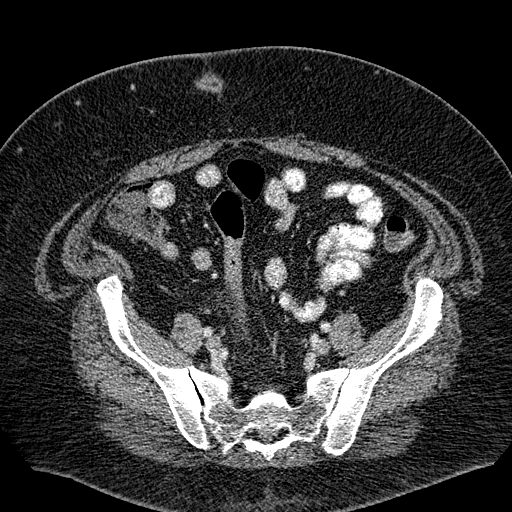
[im 79/217  mediastinal]
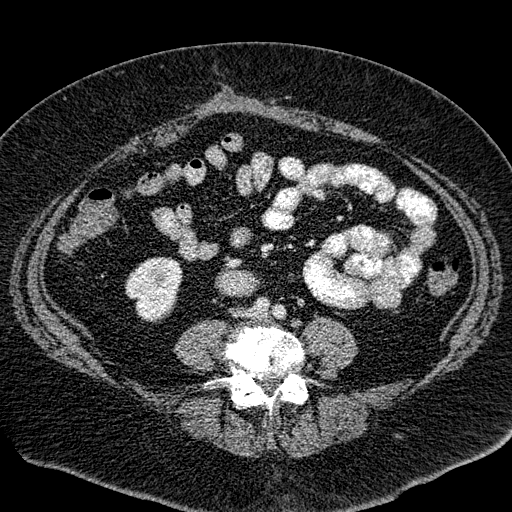
[im 99/217  mediastinal]
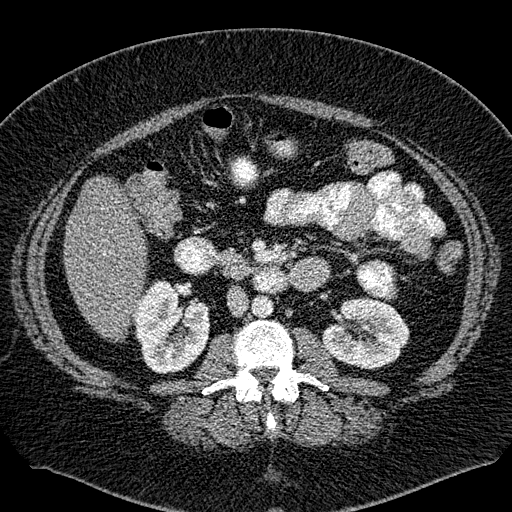
[im 106/217  mediastinal]
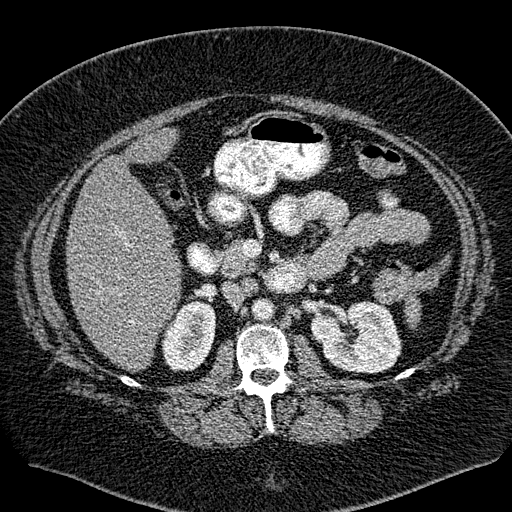
[im 109/217  mediastinal]
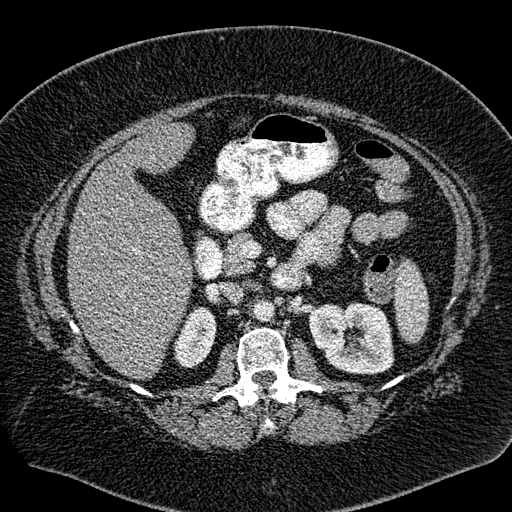
[im 118/217  mediastinal]
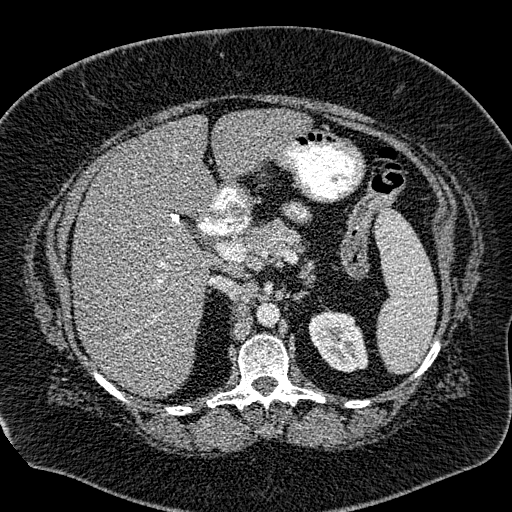
[im 138/217  mediastinal]
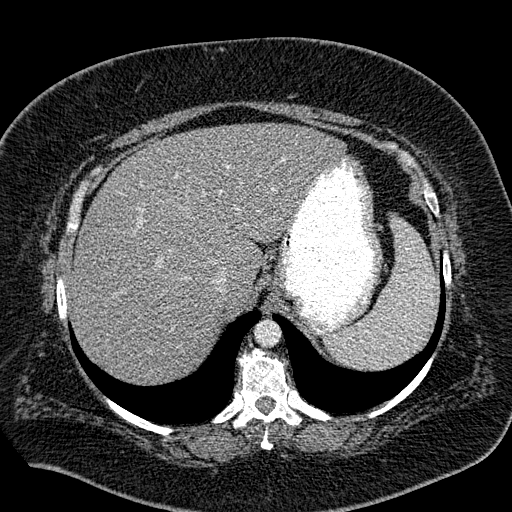
[im 138/217  lung]
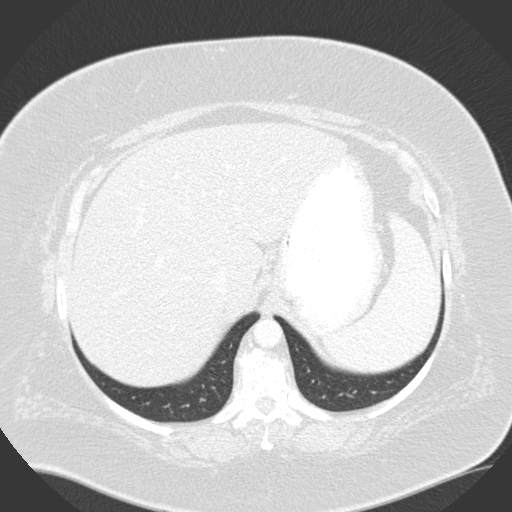
[im 138/217  bone]
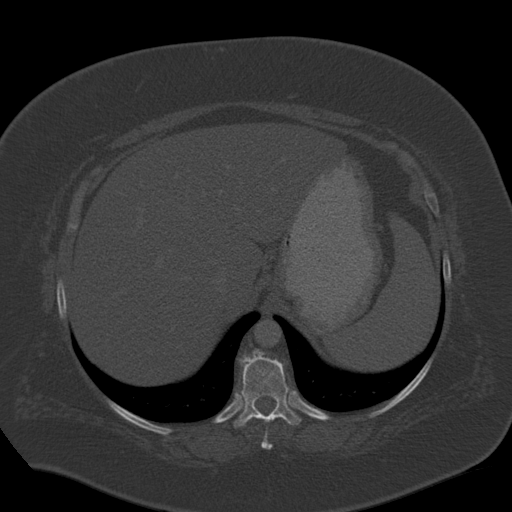
[im 158/217  mediastinal]
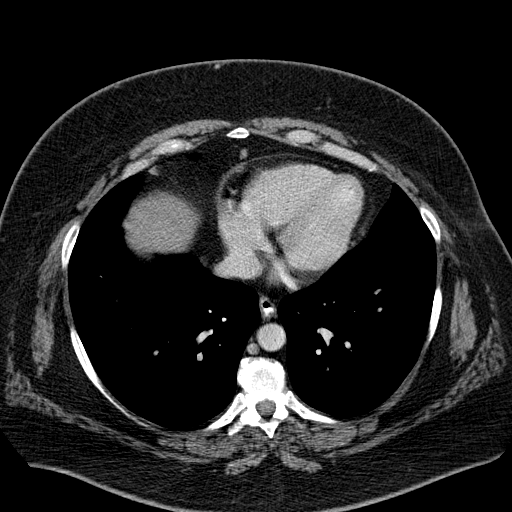
[im 158/217  lung]
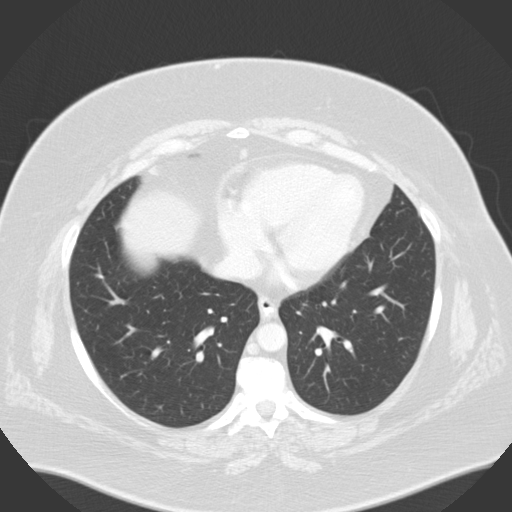
[im 177/217  mediastinal]
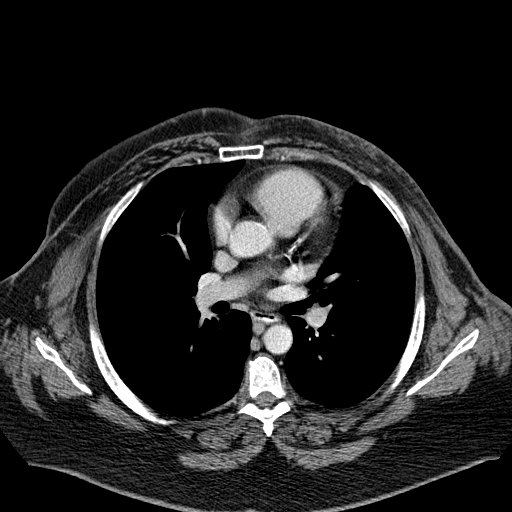
[im 177/217  lung]
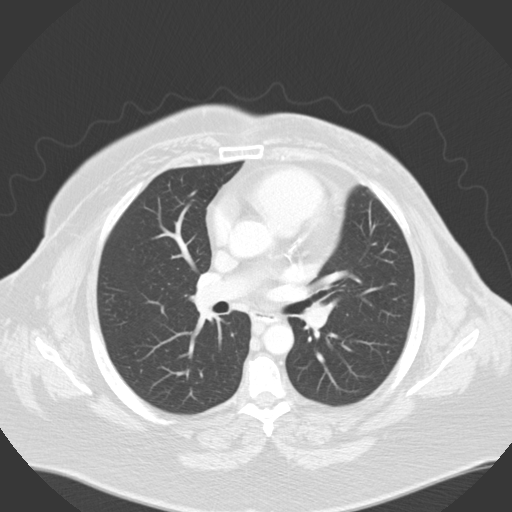
[im 197/217  mediastinal]
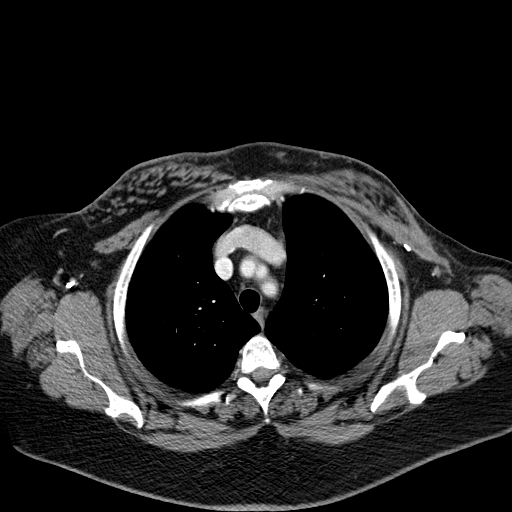
[im 197/217  lung]
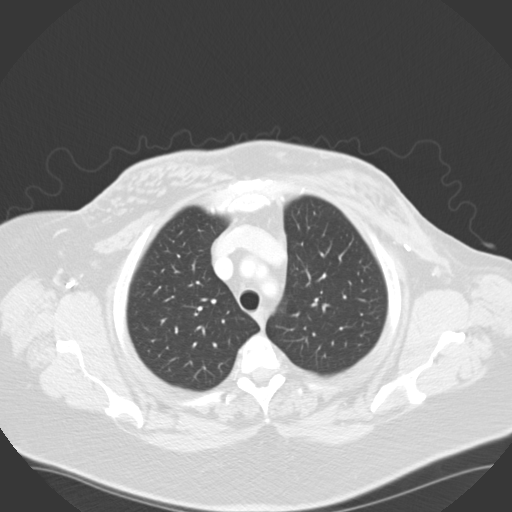

[12 of 30 positions shown; findings below may reference images not displayed]

FINDINGS: CT obtained with 100 cc of Lsovue-3GN. Mediastinum is
unremarkable. Thoracic aorta is normal. Pulmonary arteries normal.  Large
airways patent. No focal pulmonary abnormality. Postsurgical changes left
axilla. No supraclavicular or axillary adenopathy noted.

Fatty infiltration of the liver. Liver otherwise normal. Spleen normal.
Pancreas normal.

Adrenals normal. Kidneys normal. No obstructing ureteral stone. Bladder is
nondistended. Hysterectomy. Adnexa normal. No free pelvic fluid.

2.6 cm soft tissue density noted in the region of the porta hepatis.
Lymphadenopathy cannot be excluded. PET CT is suggested for further
evaluation. Shotty retroperitoneal lymph nodes.. Aorta and its branch
vessels are patent. No aneurysm. Portal vein and splenic vein patent.

Appendix normal. No bowel distention. Esophagus, stomach, duodenum normal.

Diffuse degenerative changes noted throughout the spine and both hips . The
previously identified umbilical hernia with herniation of bowel has been
relieved. There is a small fat-containing umbilical hernia present. Adjacent
mild fat plane stranding is noted. This may be postsurgical. Mild cellulitis
cannot be excluded. Chest wall is intact. Post surgical changes left axilla.
IMPRESSION: 1. A 2.6 cm soft tissue density is noted in the porta hepatis. This could
represent adenopathy. PET CT is suggested for further evaluation to evaluate
for metastatic disease given the patient's history of breast cancer. Shotty
retroperitoneal lymph nodes are also noted.
2. Previously identified umbilical hernia with herniation of bowel been has
been relieved. There is a small residual umbilical hernia with herniation of
tiny amount of fat. Mild adjacent soft tissue thickening is noted, this
could be postsurgical or related to mild cellulitis. No abscess.
3. Cholecystectomy.
4. Coronary artery disease. Post surgical changes  left axilla.

## 2016-04-13 IMAGING — CT CT HEAD WITHOUT CONTRAST
1 series · 15 of 30 positions shown, 19 images · non-contrast
Comparison: 07/01/2011

CLINICAL DATA: Metastatic breast cancer

EXAM:
CT HEAD WITHOUT CONTRAST
TECHNIQUE: Contiguous axial images were obtained from the base of the skull
through the vertex without intravenous contrast.

[Series 2: head wo · axial · 0.42mm/px · z∈[+606,+732]mm · 15 of 32 slices shown, 19 images]
[im 2/32  brain]
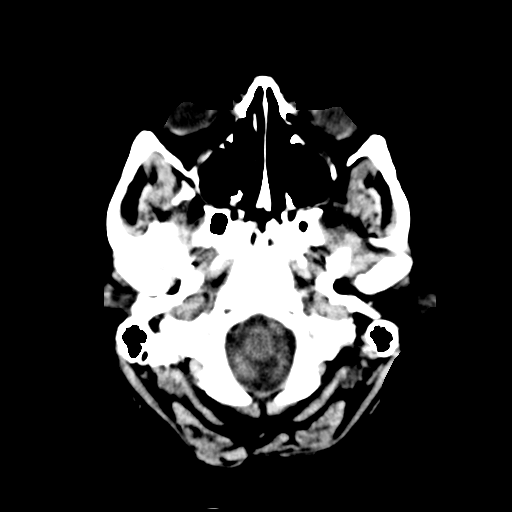
[im 2/32  bone]
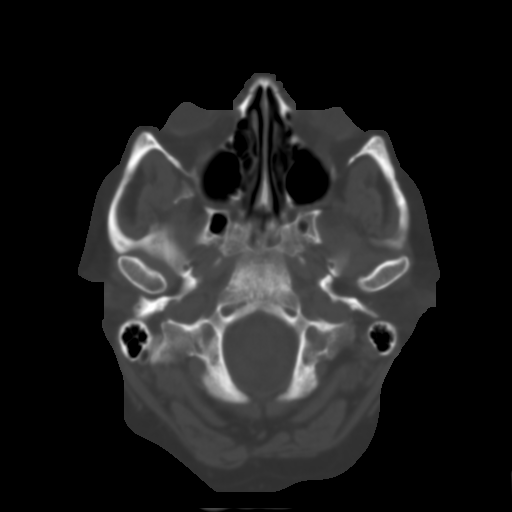
[im 4/32  brain]
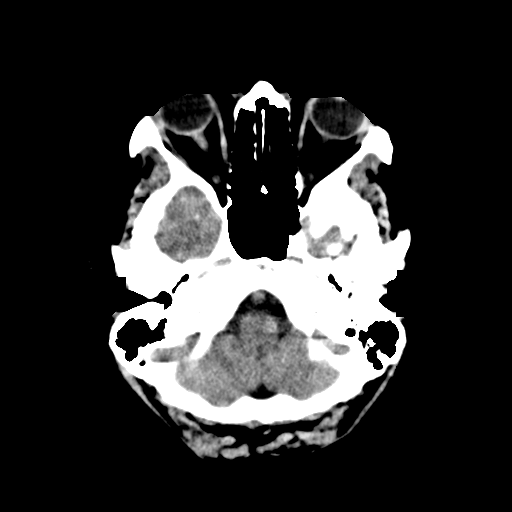
[im 6/32  brain]
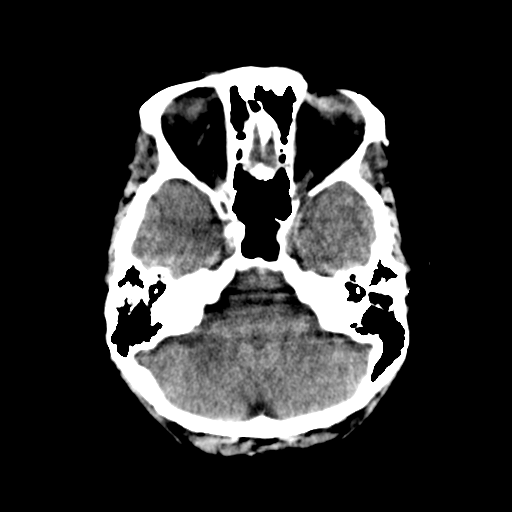
[im 8/32  brain]
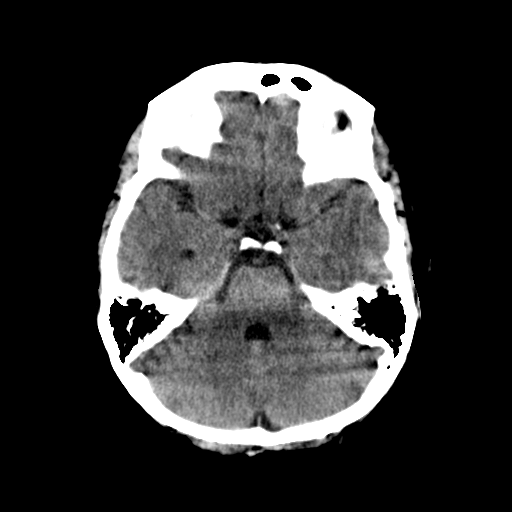
[im 10/32  brain]
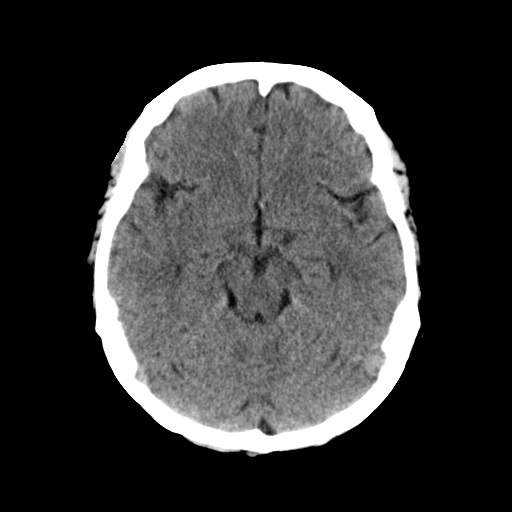
[im 10/32  bone]
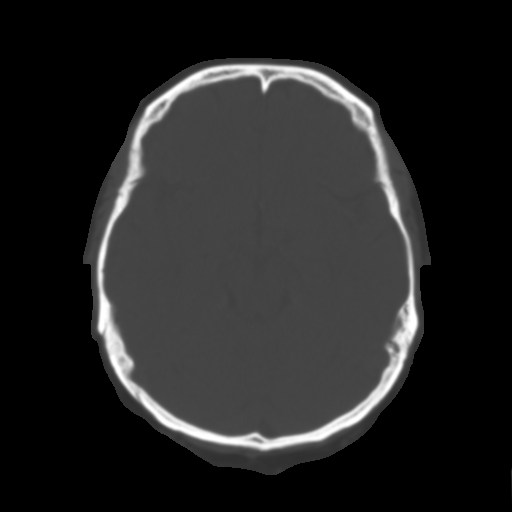
[im 12/32  brain]
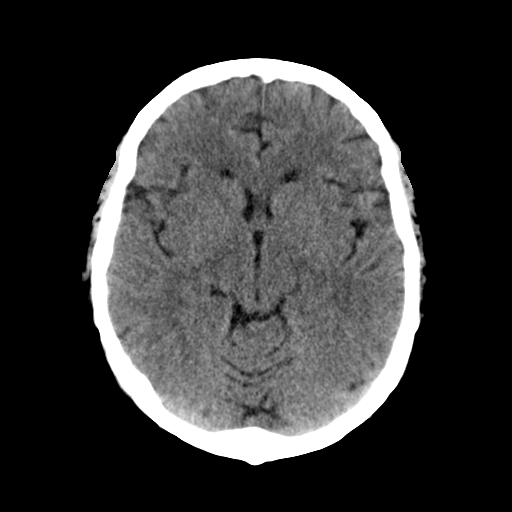
[im 14/32  brain]
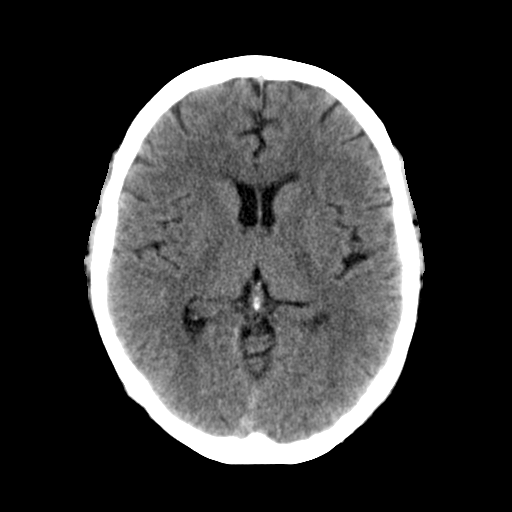
[im 17/32  brain]
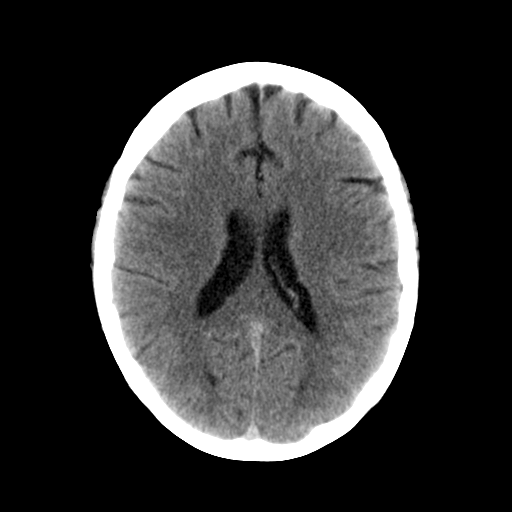
[im 18/32  brain]
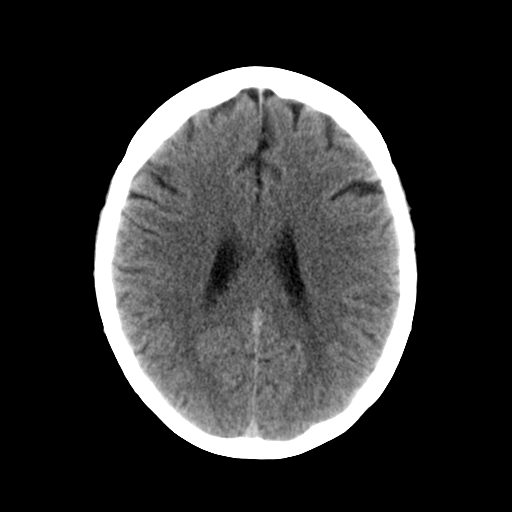
[im 18/32  bone]
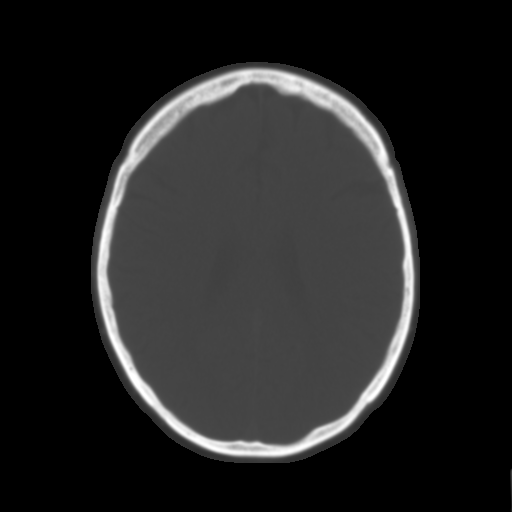
[im 20/32  brain]
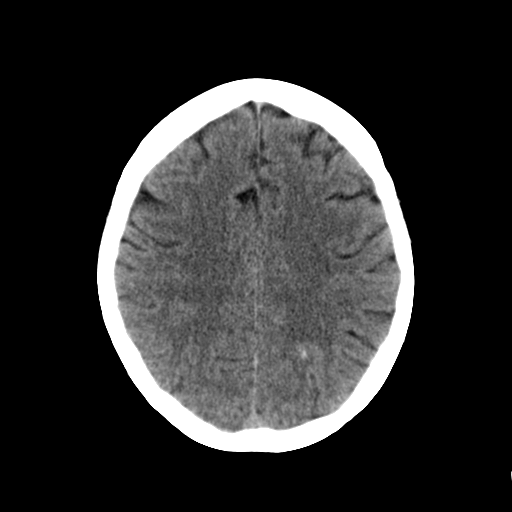
[im 22/32  brain]
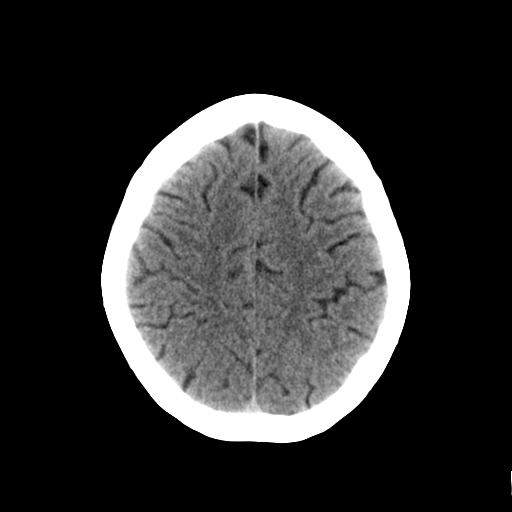
[im 24/32  brain]
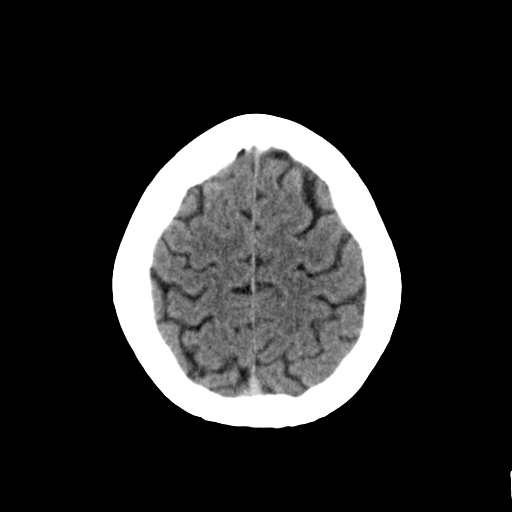
[im 26/32  brain]
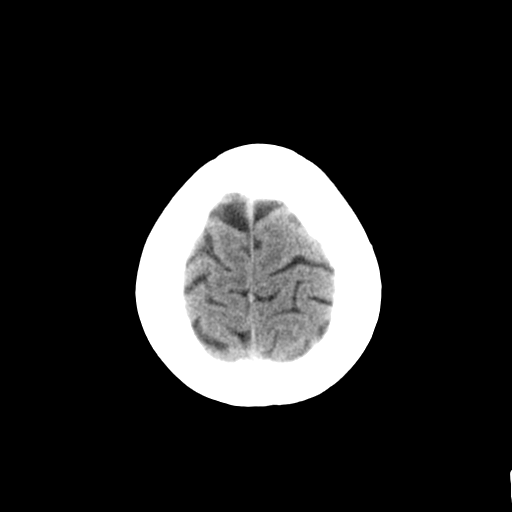
[im 26/32  bone]
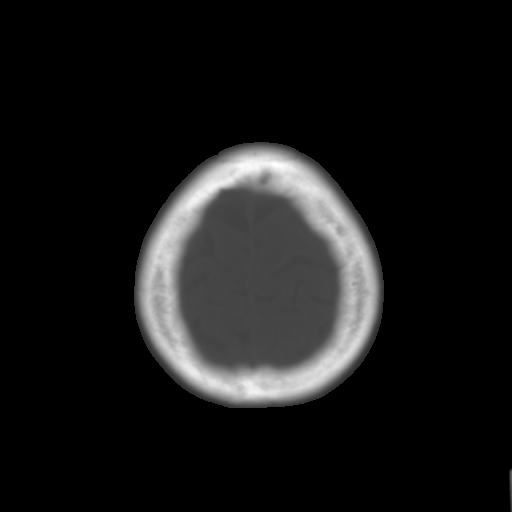
[im 28/32  brain]
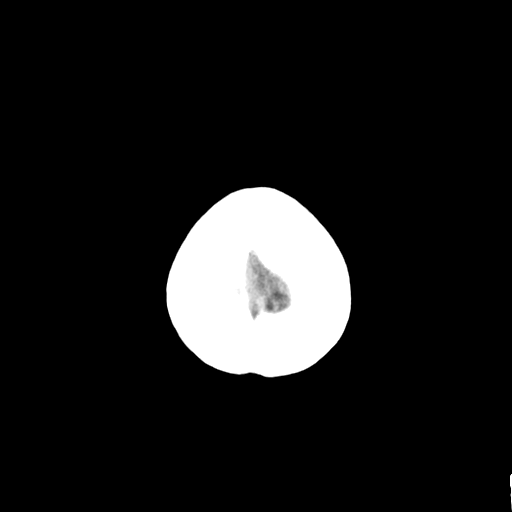
[im 30/32  brain]
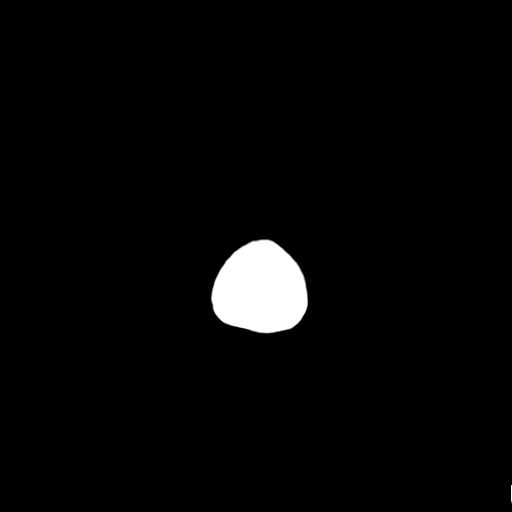

[15 of 30 positions shown; findings below may reference images not displayed]

FINDINGS: No skull fracture is noted. There is a new lytic lesion in left
posterior parietal skull axial image 23 suspicious for metastatic
disease.

No intracranial mass effect or midline shift. There is a subtle
parenchymal lesion in left parietal lobe posteriorly axial image 21
measures about 1 cm. Small foci of increased density are noted
within lesion. Hemorrhagic metastasis cannot be excluded. Further
correlation with enhanced MRI is recommended. No intraventricular
hemorrhage.
IMPRESSION: 1. There is heterogeneous parenchymal lesion with small foci of
increased density in left posterior parietal region measures about 1
cm. Further correlation with enhanced MRI is recommended to exclude
a hemorrhagic metastasis.
2. There is a new lytic skull lesion in left posterior parietal
skull suspicious for metastatic disease.
3. No mass effect or midline shift.

## 2016-05-21 IMAGING — CR DG CHEST 2V
1 series · 2 of 2 positions shown · non-contrast
Comparison: 02/17/2014

CLINICAL DATA: Productive cough.  Metastatic breast carcinoma.

EXAM:
CHEST  2 VIEW

[Series 1: dxr chest pa (or ap) and lateral · 0.14mm/px · 2 of 2 slices shown]
[im 1/2]
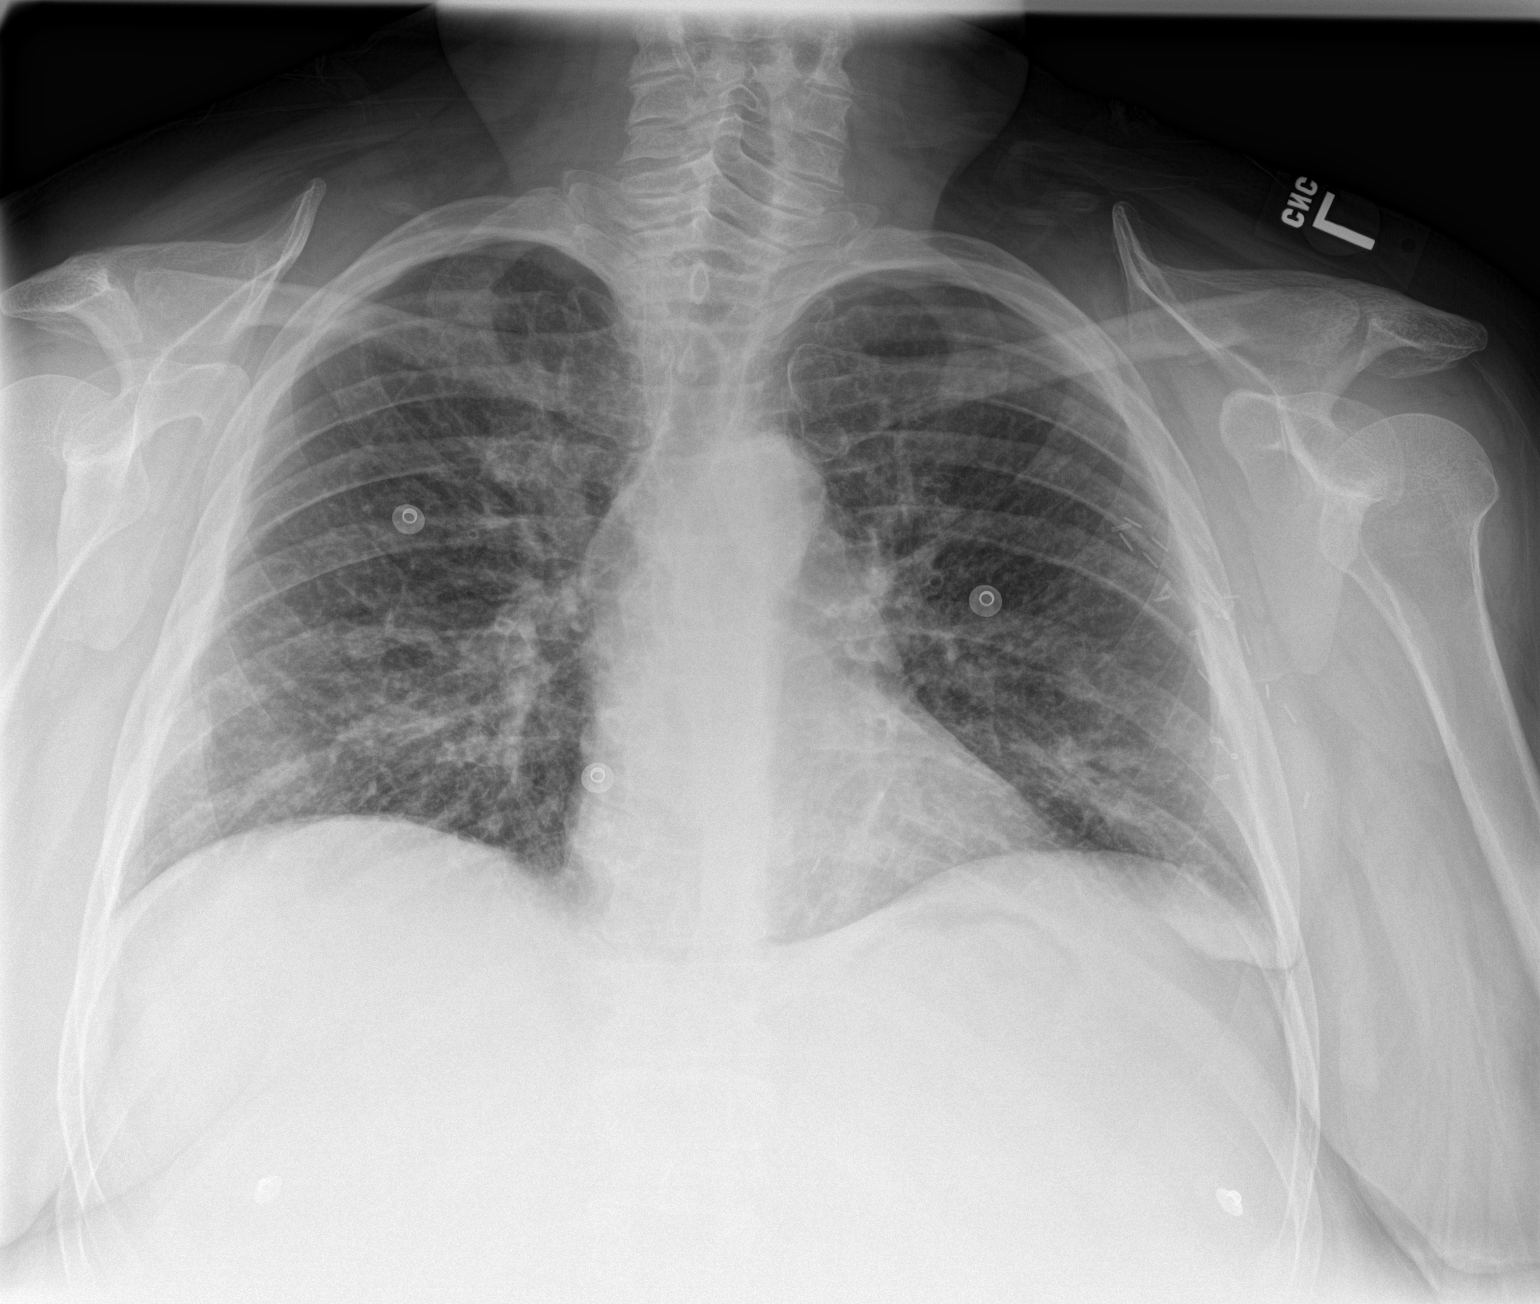
[im 2/2]
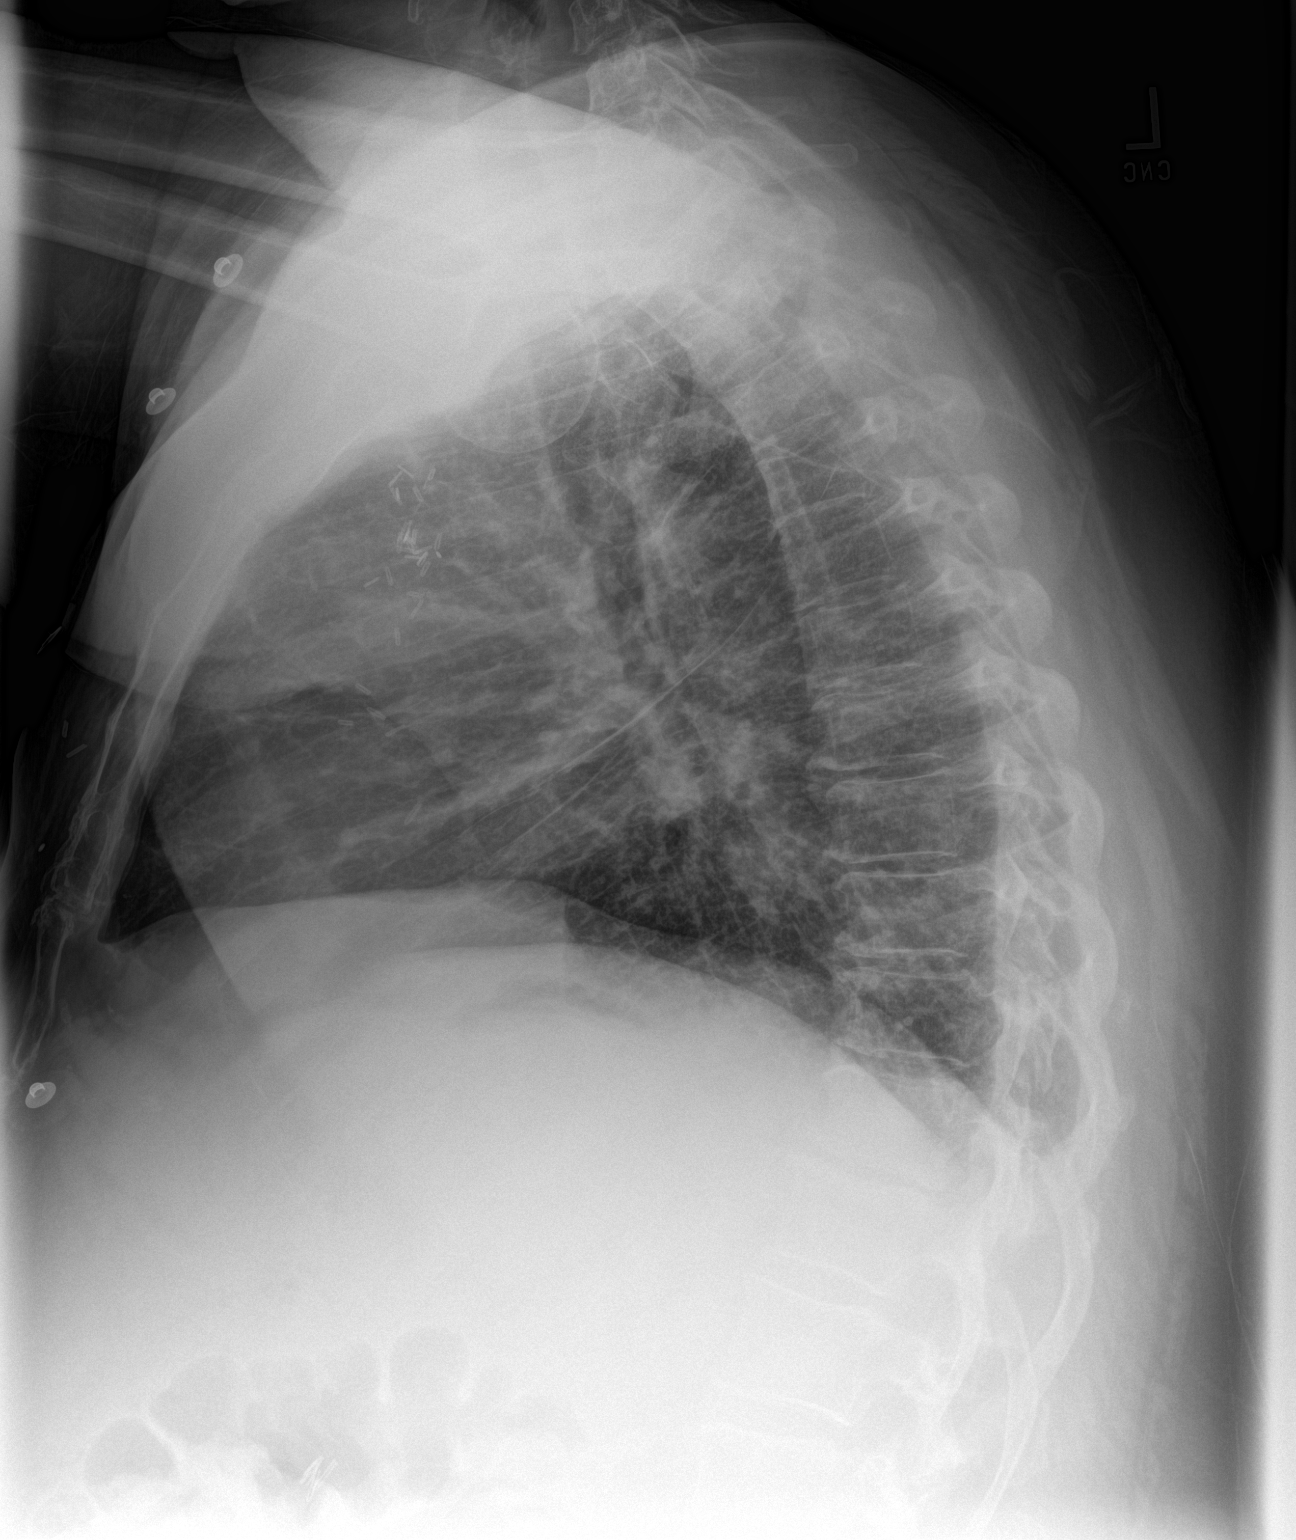

[2 of 2 positions shown; findings below may reference images not displayed]

FINDINGS: Low lung volumes are noted. New ill-defined opacity in both lower
lobes is seen, which may be due to atelectasis or pneumonia. No
evidence of pleural effusion. Heart size and mediastinal contours
remain stable.
IMPRESSION: Low lung volumes with new bilateral lower lobe opacity. Main
differential diagnosis includes atelectasis and pneumonia.

## 2020-05-19 ENCOUNTER — Encounter: Payer: Self-pay | Admitting: Ophthalmology

## 2020-05-19 ENCOUNTER — Ambulatory Visit: Payer: BC Managed Care – PPO | Attending: Ophthalmology | Admitting: Ophthalmology

## 2020-05-19 DIAGNOSIS — H2513 Age-related nuclear cataract, bilateral: Secondary | ICD-10-CM | POA: Insufficient documentation

## 2020-05-19 DIAGNOSIS — H04129 Dry eye syndrome of unspecified lacrimal gland: Secondary | ICD-10-CM | POA: Insufficient documentation

## 2020-05-19 DIAGNOSIS — H538 Other visual disturbances: Secondary | ICD-10-CM | POA: Insufficient documentation

## 2020-05-19 MED ORDER — DULOXETINE HCL 30 MG OR CPEP
30.0000 mg | ORAL_CAPSULE | Freq: Every day | ORAL | Status: AC
Start: 2020-03-26 — End: ?

## 2020-05-19 MED ORDER — K2 PLUS D3 100-1000 MCG-UNIT PO TABS: ORAL_TABLET | ORAL | Status: AC

## 2020-05-19 MED ORDER — MAGNESIUM OXIDE 400 MG OR TABS: 400.0000 mg | ORAL_TABLET | Freq: Every day | ORAL | Status: AC

## 2020-05-19 MED ORDER — ARMOUR THYROID 30 MG OR TABS
ORAL_TABLET | ORAL | Status: AC
Start: 2020-04-03 — End: ?

## 2020-05-19 MED ORDER — SUMATRIPTAN SUCCINATE 6 MG/0.5ML SC SOAJ
SUBCUTANEOUS | Status: AC
Start: 2020-04-30 — End: ?

## 2020-05-19 MED ORDER — IBUPROFEN 100 MG OR TABS: ORAL_TABLET | ORAL | Status: AC

## 2020-05-19 MED ORDER — ARTIFICIAL TEARS OP: OPHTHALMIC | Status: AC

## 2020-05-19 NOTE — Progress Notes (Signed)
1. Dry eyes  -pf ats often, ointmetn nightly  -Add warm compresses  -Discussed plugs and restasis    2. Cataracts, both eyes  -Mild, not yet vs, observe      N ext visit, inflammadry test    I performed the above HPI. I reviewed and confirmed the techs review of systems, past histories, and readings.    Prentice Docker, M.D.  Assistant Professor of Ophthalmology  [facility]

## 2020-06-16 ENCOUNTER — Ambulatory Visit: Payer: Self-pay | Admitting: Ophthalmology

## 2020-07-09 ENCOUNTER — Ambulatory Visit: Payer: 59 | Admitting: Ophthalmology

## 2021-07-24 ENCOUNTER — Emergency Department
Admission: EM | Admit: 2021-07-24 | Discharge: 2021-07-24 | Disposition: A | Discharge status: Home / Self Care | Payer: BC Managed Care – PPO | Attending: Emergency Medicine | Admitting: Emergency Medicine

## 2021-07-24 ENCOUNTER — Emergency Department: Payer: BC Managed Care – PPO

## 2021-07-24 DIAGNOSIS — I10 Essential (primary) hypertension: Secondary | ICD-10-CM | POA: Insufficient documentation

## 2021-07-24 DIAGNOSIS — R519 Headache, unspecified: Secondary | ICD-10-CM

## 2021-07-24 DIAGNOSIS — S060X0A Concussion without loss of consciousness, initial encounter: Secondary | ICD-10-CM

## 2021-07-24 DIAGNOSIS — I498 Other specified cardiac arrhythmias: Secondary | ICD-10-CM

## 2021-07-24 DIAGNOSIS — G43909 Migraine, unspecified, not intractable, without status migrainosus: Secondary | ICD-10-CM | POA: Insufficient documentation

## 2021-07-24 DIAGNOSIS — W1830XA Fall on same level, unspecified, initial encounter: Secondary | ICD-10-CM | POA: Insufficient documentation

## 2021-07-24 DIAGNOSIS — Z880 Allergy status to penicillin: Secondary | ICD-10-CM | POA: Insufficient documentation

## 2021-07-24 DIAGNOSIS — R42 Dizziness and giddiness: Secondary | ICD-10-CM | POA: Insufficient documentation

## 2021-07-24 DIAGNOSIS — Z882 Allergy status to sulfonamides status: Secondary | ICD-10-CM | POA: Insufficient documentation

## 2021-07-24 HISTORY — DX: Migraine, unspecified, not intractable, without status migrainosus: G43.909

## 2021-07-24 HISTORY — DX: Essential (primary) hypertension: I10

## 2021-07-24 MED ORDER — MECLIZINE HCL 25 MG OR TABS
25.0000 mg | ORAL_TABLET | Freq: Three times a day (TID) | ORAL | 0 refills | Status: AC | PRN
Start: 2021-07-24 — End: ?

## 2021-07-24 MED ORDER — MECLIZINE HCL 12.5 MG OR TABS
25.0000 mg | ORAL_TABLET | Freq: Once | ORAL | Status: AC
Start: 2021-07-24 — End: 2021-07-24
  Administered 2021-07-24 (×2): 25 mg via ORAL
  Filled 2021-07-24: qty 2

## 2021-07-24 NOTE — ED Provider Notes (Signed)
 CHIEF COMPLAINT  Head Injury (Pt states she fell backwards off a chair last night and hit her head. -LOC. Now dizzy, lightheaded.)      HISTORY OF PRESENT ILLNESS:   Erica Sandoval is a 60 year old female with PMH of HTN, migraine, who presents with head injury. Patient states that she was at her parent's home, sat in a chair, which broke, with patient coming back and hitting corner of wall and bit her lip.  No LOC.  Patient has been feeling light headed and dizzy since.  No room spinning sensation.  Patient woke up this morning since, with patient coming here for further evaluation, out of concern for bleed.      Location: head  Radiation: None  Quality: light headed   Severity: moderate  Duration: hours  Timing: constant  Context: GLF    Pt reports was visiting her 44 yr old mother sitting in a "65 year old chair" and fell back and hit her head.  NO neck pain.  NO focal weakness.      REVIEW OF SYSTEMS:  Constitutional: - fever, chills  Head: + headache, dizziness  Eyes: - vision change  ENT: - hearing change  Endo: - polyuria  CV: - chest pain  Resp: - shortness of breath, cough  GI: - nausea, - vomiting  GU: - hematuria, - dysuria  Back: - back pain  Skin: - rash  Neuro: - focal weakness    All other systems reviewed and negative except as noted above    PAST MEDICAL HISTORY:  Past Medical History:   Diagnosis Date   . Hypertension    . Migraine      There are no problems to display for this patient.       SURGICAL HISTORY:  No past surgical history on file. Denied    ALLERGIES:  Allergies   Allergen Reactions   . Penicillins Rash   . Sulfa Drugs Rash         CURRENT MEDICATIONS:   Please see nursing notes    FAMILY HISTORY:  Reviewed and noncontributory:      SOCIAL HISTORY:  Tobacco: -  Alcohol: -  Drug use: -    VITAL SIGNS:  First Vitals [07/24/21 1004]   Temperature Heart Rate Respirations Blood pressure (BP) SpO2   99 F (37.2 C) 92 16 135/81 99 %       PHYSICAL EXAM:  General: Awake, alert, appears to be  in no distress   Head: Normocephalic, atraumatic, slight hematoma noted R occipital area without abrasion or laceration  Eyes: no scleral icterus, no conjunctival injection, extraocular motion intact, PERRL  ENT: normal appearing ears externally, normal appearing nose externally   Neck: Supple, full range of motion of neck, no midline cspine ttp  Respiratory: normal effort, no audible stridor   Cardiovascular: normal rate, no murmurs, well perfused   Abdomen: Soft, non-tender, non-distended.   Rectal: deferred   GU: deferred   Back: No costovertebral angle tenderness, no C/T/L spine tenderness, no step offs or deformities    Skin: No jaundice, no rash   Extremities: No deformity, no edema  Neuro: Awake, alert, moving all extremities, CN II-XI intact, motor 5/5, sensation grossly intact, finger to nose/rapid alternating hand/rhomberg without note   Psych: Cooperative with exam, conversing appropriately , appropriate mood and affect    MEDICAL DECISION MAKING:  Erica Sandoval is a 60 year old female who presents with light headed sensation after fall and hitting head.  Differential diagnosis includes acute intracranial process (SDH, EDH, SAH, etc), concussion vs doubt skull fx vs no e/o tia or CVA at this time.  Pt is overall well appearing, no apparent distress. Will keep ddx broad given history and presentation.Will obtain CT head with planned symptom control.     Workup Summary       Value Comment By Time      ** head injury, fell off chair last night and hit head after chair broke, hit head, felt lightheaded this morning Massie Maroon, Kristine Garbe, MD 12/31 1024      IMPRESSION:No acute intracranial abnormality.END IMPRESSION Lawana Pai, MD 12/31 1153      Symptom improvement noted.  Lawana Pai, MD 12/31 1202          DIAGNOSIS:    ICD-10-CM ICD-9-CM   1. Fall from ground level  W18.30XA E888.9   2. Concussion without loss of consciousness, initial encounter  S06.0X0A 850.0   3. Light headed  R42 780.4     CT  without acute process. Given mechanical fall, without apparent injury labs held.  EKG without note.  Pain controlled.  Meclizine given, with resolution of light headed sensation. Has PCP follow up. Patient in stable condition and felt safe for discharge.  All questions answered.  Strict return precautions given along with follow up instructions.           Lawana Pai, MD  Resident  07/25/21 [phone]    Attending Attestation: I was present with the resident during the history and exam.  I discussed the case with the resident and agree with the findings and plan as documented by the resident.  My additions or revisions are included in the record.    Kathlene November, MD, MPH  Department of Emergency Medicine  [facility]    07/26/21 10:39 AM       Jeanette Caprice, MD  07/26/21 1039

## 2021-07-24 NOTE — Discharge Instructions (Signed)
 Please  follow-up with your PCP in 3 to 5 days or sooner if needed. The follow-up appointment is a very important part of your ongoing medical evaluation and management.    Return to the emergency department immediately for increasing pain, vomiting, weakness, changes in sensation, or any new or worsening symptoms.

## 2021-07-24 NOTE — ED EKG Interpretation (Signed)
ED EKG Interpretation    EKG: SR @ 83, no e/o ischemia or ectopy

## 2021-07-25 LAB — ECG 12-LEAD
P AXIS: 75 Deg
PR INTERVAL: 179 ms
QRS INTERVAL/DURATION: 75 ms
QT: 311 ms
QTc (Bazett): 351 ms
R AXIS: -6 Deg
R-R INTERVAL AVERAGE: 720 ms
T AXIS: 51 Deg
VENTRICULAR RATE: 83 {beats}/min

## 2021-07-28 ENCOUNTER — Emergency Department
Admission: EM | Admit: 2021-07-28 | Discharge: 2021-07-28 | Disposition: A | Discharge status: Home / Self Care | Payer: BC Managed Care – PPO | Attending: Emergency Medicine | Admitting: Emergency Medicine

## 2021-07-28 DIAGNOSIS — R112 Nausea with vomiting, unspecified: Secondary | ICD-10-CM | POA: Insufficient documentation

## 2021-07-28 DIAGNOSIS — R0602 Shortness of breath: Secondary | ICD-10-CM

## 2021-07-28 DIAGNOSIS — I10 Essential (primary) hypertension: Secondary | ICD-10-CM | POA: Insufficient documentation

## 2021-07-28 DIAGNOSIS — G43909 Migraine, unspecified, not intractable, without status migrainosus: Secondary | ICD-10-CM | POA: Insufficient documentation

## 2021-07-28 LAB — CBC WITH DIFF, BLOOD
ANC automated: 9.4 10*3/uL — ABNORMAL HIGH (ref 2.0–8.1)
Basophils %: 0.3 %
Basophils Absolute: 0 10*3/uL (ref 0.0–0.2)
Eosinophils %: 0.2 %
Eosinophils Absolute: 0 10*3/uL (ref 0.0–0.5)
Hematocrit: 45.3 % — ABNORMAL HIGH (ref 34.0–44.0)
Hgb: 15.2 G/DL — ABNORMAL HIGH (ref 11.5–15.0)
Lymphocytes %: 12.9 %
Lymphocytes Absolute: 1.5 10*3/uL (ref 0.9–3.3)
MCH: 29 PG (ref 27.0–33.5)
MCHC: 33.6 G/DL (ref 32.0–35.5)
MCV: 86.3 FL (ref 81.5–97.0)
MPV: 8.5 FL (ref 7.2–11.7)
Monocytes %: 3.4 %
Monocytes Absolute: 0.4 10*3/uL (ref 0.0–0.8)
Neutrophils % (A): 83.2 %
PLT Count: 354 10*3/uL (ref 150–400)
RBC: 5.25 10*6/uL — ABNORMAL HIGH (ref 3.70–5.00)
RDW-CV: 13.4 % (ref 11.6–14.4)
White Bld Cell Count: 11.3 10*3/uL — ABNORMAL HIGH (ref 4.0–10.5)

## 2021-07-28 LAB — TROPONIN I, HIGH SENSITIVITY: Troponin I, High Sensitivity: 4 ng/L (ref 0–15)

## 2021-07-28 LAB — COMPREHENSIVE METABOLIC PANEL, BLOOD
ALT: 30 U/L (ref 7–52)
AST: 20 U/L (ref 13–39)
Albumin: 4.6 G/DL (ref 3.7–5.3)
Alk Phos: 81 U/L (ref 34–104)
BUN: 16 mg/dL (ref 7–25)
Bilirubin, Total: 0.7 mg/dL (ref 0.0–1.4)
CO2: 24 mmol/L (ref 21–31)
Calcium: 9.7 mg/dL (ref 8.6–10.3)
Chloride: 104 mmol/L (ref 98–107)
Creat: 0.9 mg/dL (ref 0.6–1.2)
Electrolyte Balance: 9 mmol/L (ref 2–12)
Glucose: 103 mg/dL (ref 85–125)
Potassium: 4.4 mmol/L (ref 3.5–5.1)
Protein, Total: 7.6 G/DL (ref 6.0–8.3)
Sodium: 137 mmol/L (ref 136–145)
eGFR - high estimate: >60 (ref >59)
eGFR - low estimate: >60 (ref >59)

## 2021-07-28 LAB — ECG 12-LEAD: PR INTERVAL: 168 ms

## 2021-07-28 LAB — COVID-19, FLU A/B PANEL (POC)
COVID-19 Result: NOT DETECTED
Influenza A, PCR: NOT DETECTED
Influenza B, PCR: NOT DETECTED
Respiratory Virus Comment: NOT DETECTED

## 2021-07-28 LAB — PHOSPHORUS, BLOOD: Phosphorus: 3.3 MG/DL (ref 2.5–5.0)

## 2021-07-28 LAB — MAGNESIUM, BLOOD: Magnesium: 2 mg/dL (ref 1.9–2.7)

## 2021-07-28 LAB — LIPASE, BLOOD: Lipase: 13 U/L (ref 11–82)

## 2021-07-28 MED ORDER — METOCLOPRAMIDE HCL 5 MG/ML IJ SOLN
10.0000 mg | Freq: Once | INTRAMUSCULAR | Status: DC
Start: 2021-07-28 — End: 2021-07-29

## 2021-07-28 MED ORDER — DIPHENHYDRAMINE HCL 50 MG/ML IJ SOLN
25.0000 mg | Freq: Once | INTRAMUSCULAR | Status: DC
Start: 2021-07-28 — End: 2021-07-29

## 2021-07-28 MED ORDER — LACTATED RINGERS IV BOLUS (~~LOC~~)
1000.0000 mL | Freq: Once | INTRAVENOUS | Status: DC
Start: 2021-07-28 — End: 2021-07-29

## 2021-07-28 NOTE — ED Provider Notes (Signed)
 CHIEF COMPLAINT:  Vomiting (C/o H/A, N/V/D. Was seen at [facility] ED 4 days ago. )     HISTORY OF PRESENT ILLNESS:   Erica Sandoval is a 61 year old female w/ migraines, HTN who presents with vomiting. Pt was at parent's house and hit head 4 days, had CT scan at t

## 2021-07-28 NOTE — ED Notes (Signed)
Pt refused meds . 'Wanting to go home and drink water and take tylenol.' Made Mds aware

## 2021-07-28 NOTE — Discharge Instructions (Signed)
 Please follow-up with your primary care provider in 2-3 days for repeat evaluation and, if indicated, further management of your symptoms.    Return to the emergency department IMMEDIATELY for weakness to the arms or legs, passing out, severe headache, or

## 2021-07-28 NOTE — ED EKG Interpretation (Signed)
ED EKG Interpretation    EKG: Normal Sinus Rhythm with Normal Axis and no ischemic changes.

## 2021-07-30 LAB — ECG 12-LEAD
P AXIS: 84 Deg
QRS INTERVAL/DURATION: 84 ms
QT: 328 ms
QTc (Bazett): 375 ms
R AXIS: 82 Deg
R-R INTERVAL AVERAGE: 671 ms
T AXIS: 66 Deg
VENTRICULAR RATE: 89 {beats}/min
